# Patient Record
Sex: Female | Born: 1980 | Race: Asian | Hispanic: No | Marital: Married | Smoking: Never smoker
Health system: Southern US, Community
[De-identification: ages and names within clinical notes are randomized; demographics above are authoritative.]

## PROBLEM LIST (undated history)

## (undated) DIAGNOSIS — E119 Type 2 diabetes mellitus without complications: Secondary | ICD-10-CM

## (undated) DIAGNOSIS — E785 Hyperlipidemia, unspecified: Secondary | ICD-10-CM

## (undated) HISTORY — DX: Type 2 diabetes mellitus without complications: E11.9

## (undated) HISTORY — PX: BREAST EXCISIONAL BIOPSY: SUR124

## (undated) HISTORY — DX: Hyperlipidemia, unspecified: E78.5

## (undated) HISTORY — PX: BREAST BIOPSY: SHX20

---

## 2009-05-17 HISTORY — PX: MICRODISCECTOMY LUMBAR: SUR864

## 2011-08-23 IMAGING — CR Esophagram
2 series · 2 of 2 positions shown · non-contrast
Comparison: none

Final Report

Visit reason:  Dysphagia;
TECHNIQUE: Multiple fluoroscopic spot films and overhead views were 
obtained.

[PA]
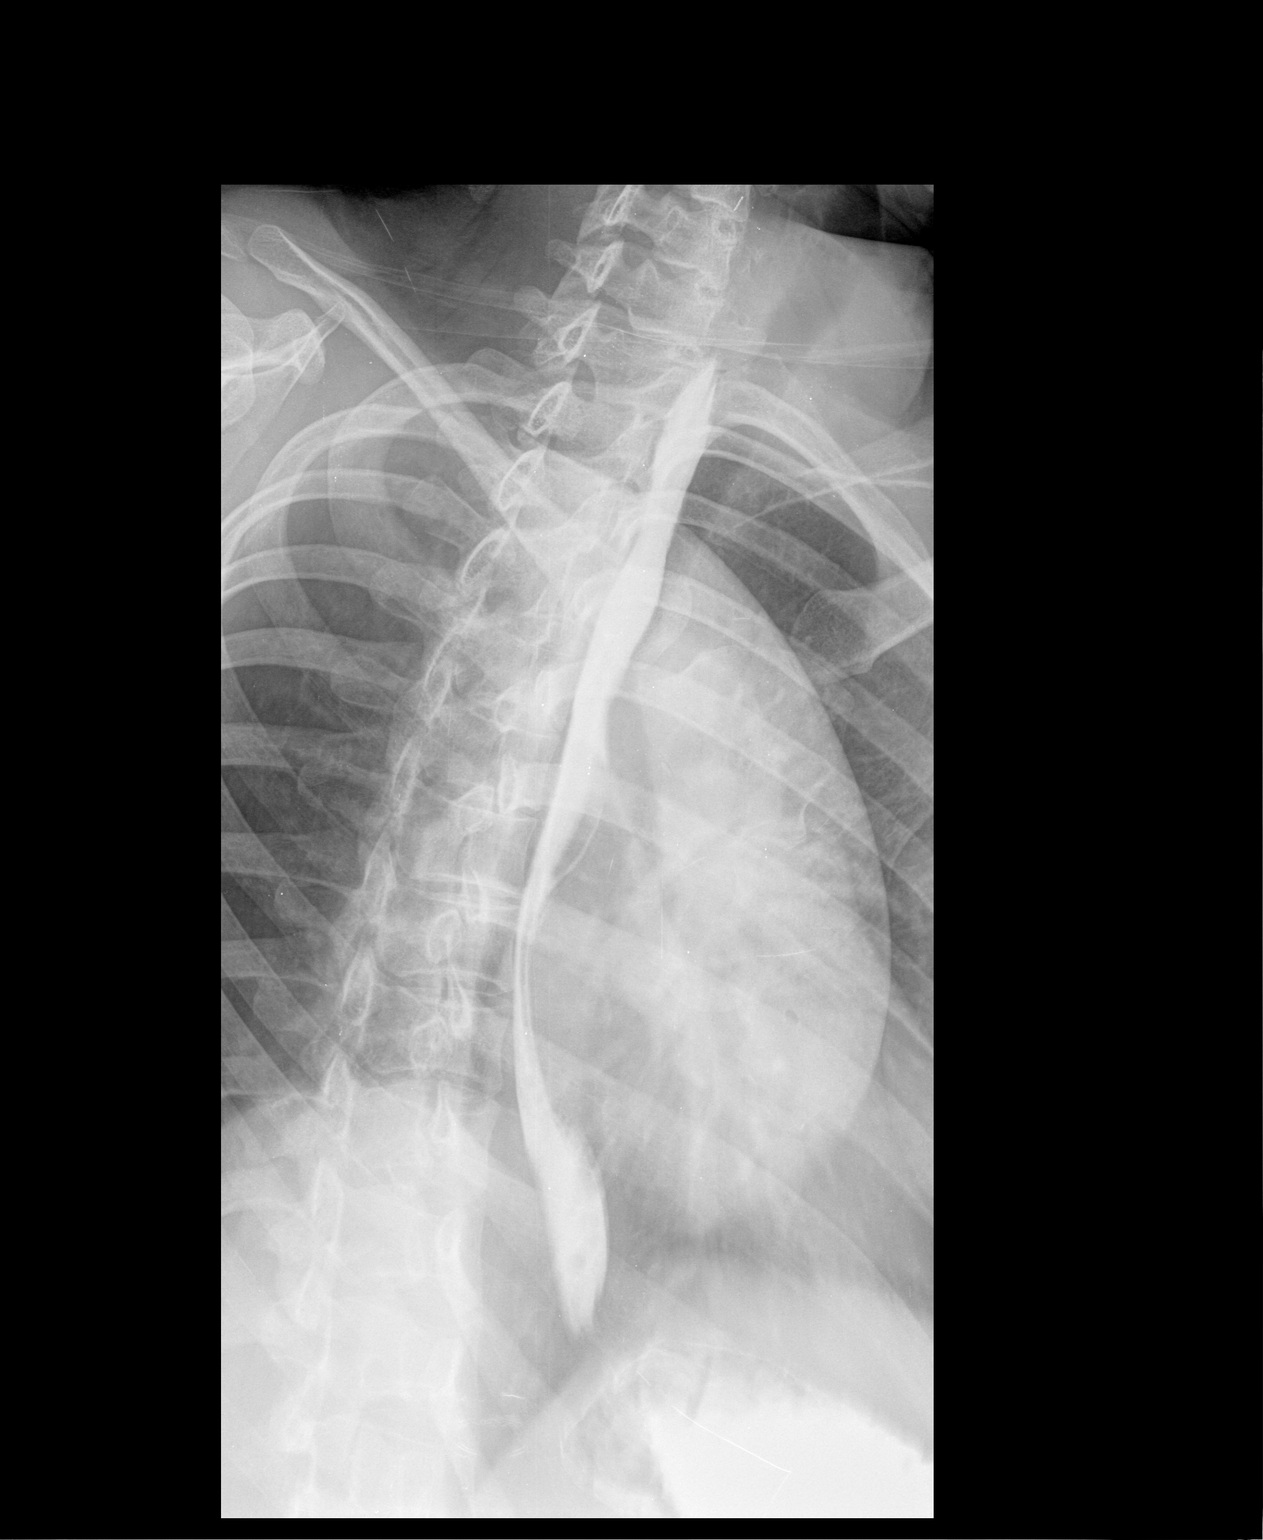

[left lateral]
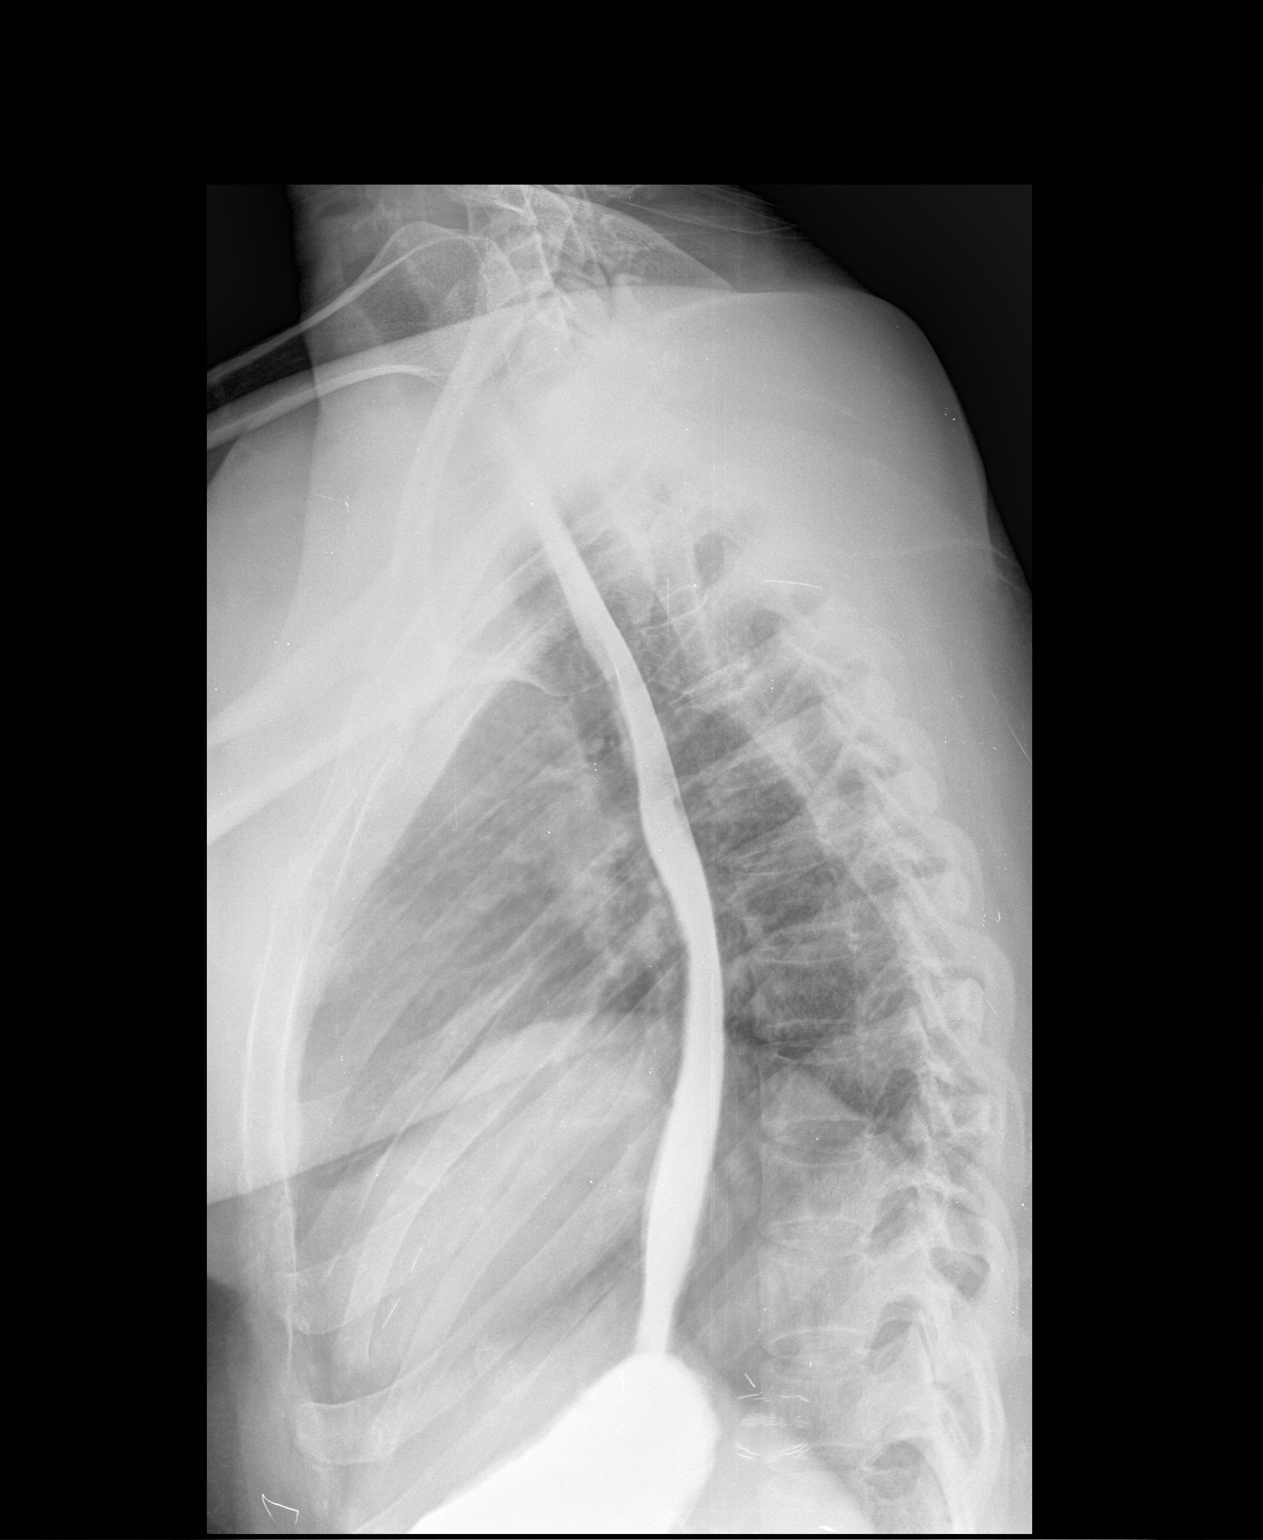

[2 of 2 positions shown; findings below may reference images not displayed]

FINDINGS: Examination of the esophagus demonstrates normal peristalsis with no 

evidence of stricture or  neoplasm.  There is no hiatus hernia or 

reflux.
IMPRESSION: Normal esophagram

## 2011-08-23 IMAGING — RF DR STANDARD
1 series · 6 of 6 positions shown · non-contrast
Comparison: none

Final Report

Visit reason:  Dysphagia;
TECHNIQUE: Multiple fluoroscopic spot films and overhead views were 
obtained.

[Series 1: single · 6 of 6 slices shown]
[im 1/6]
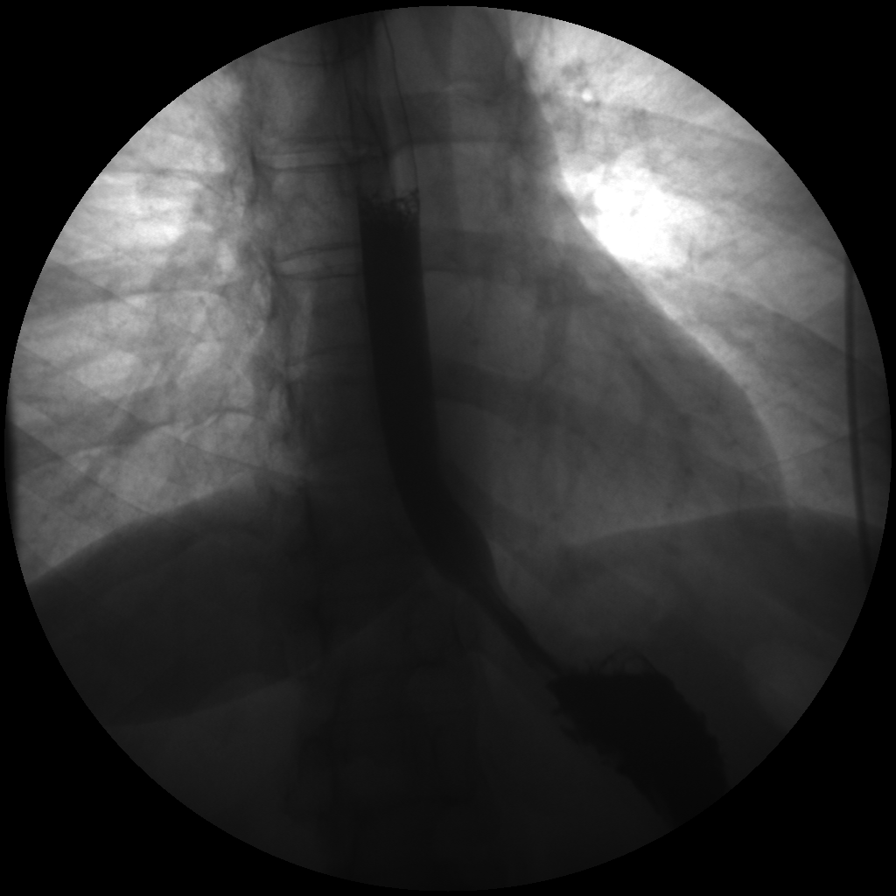
[im 2/6]
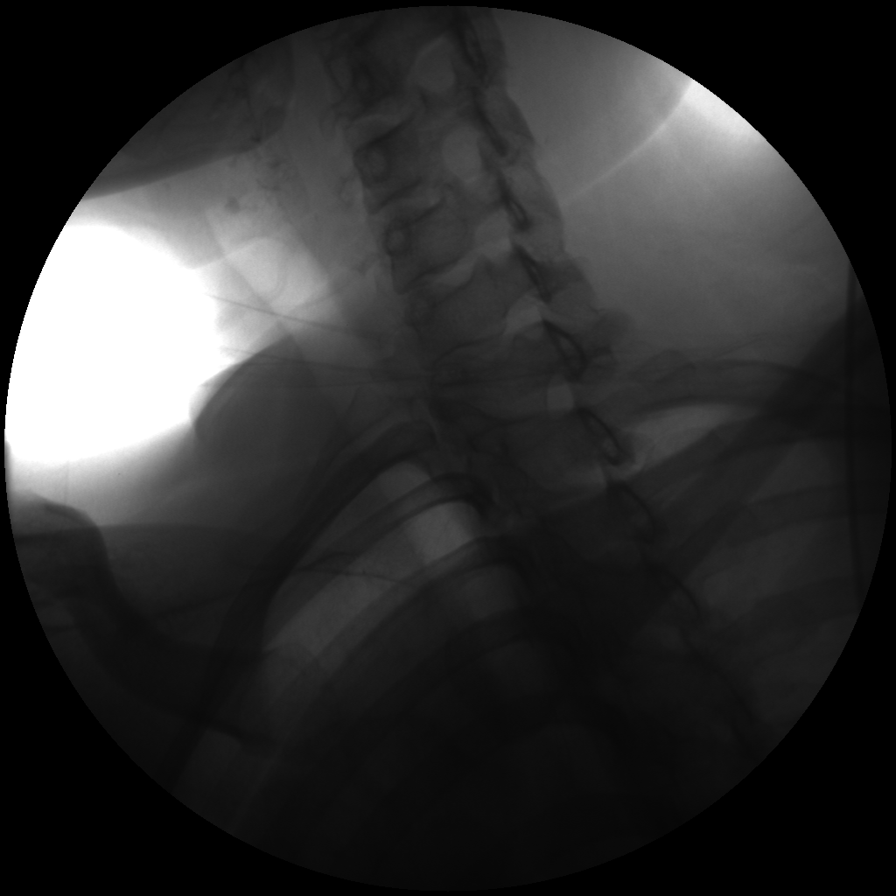
[im 3/6]
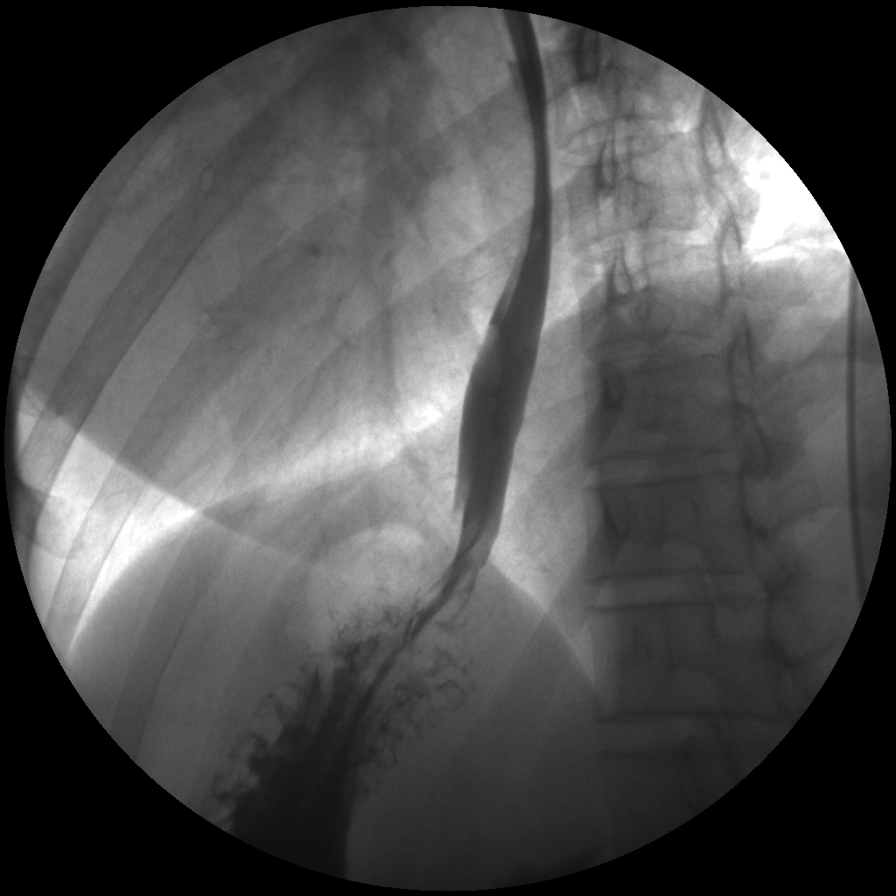
[im 4/6]
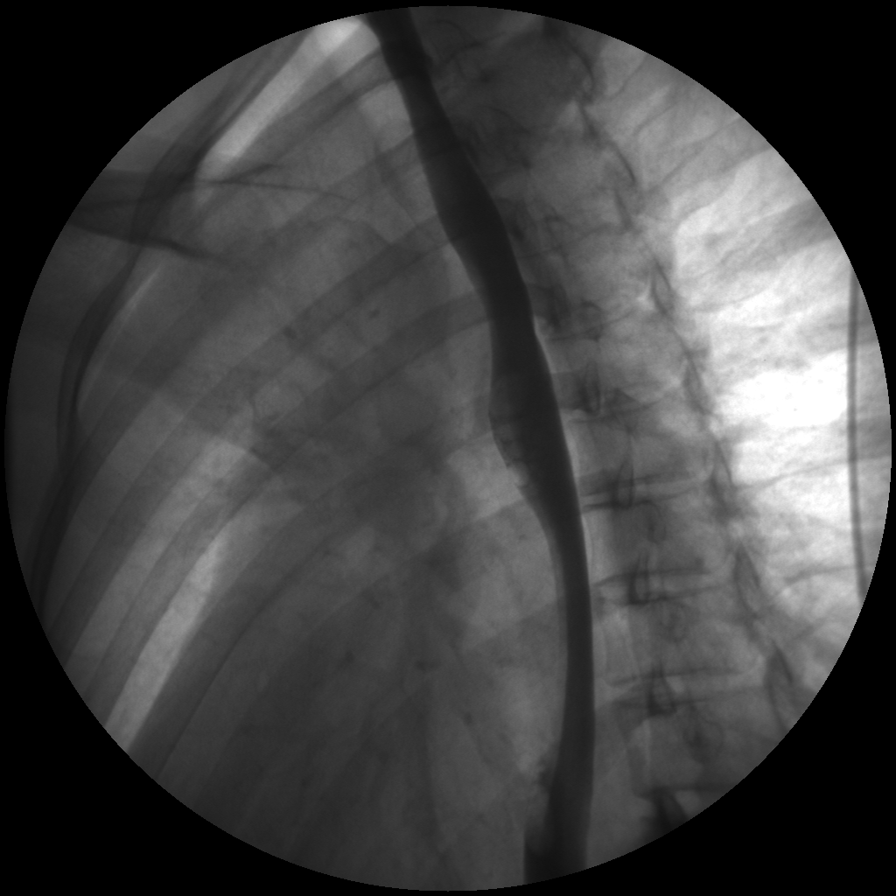
[im 5/6]
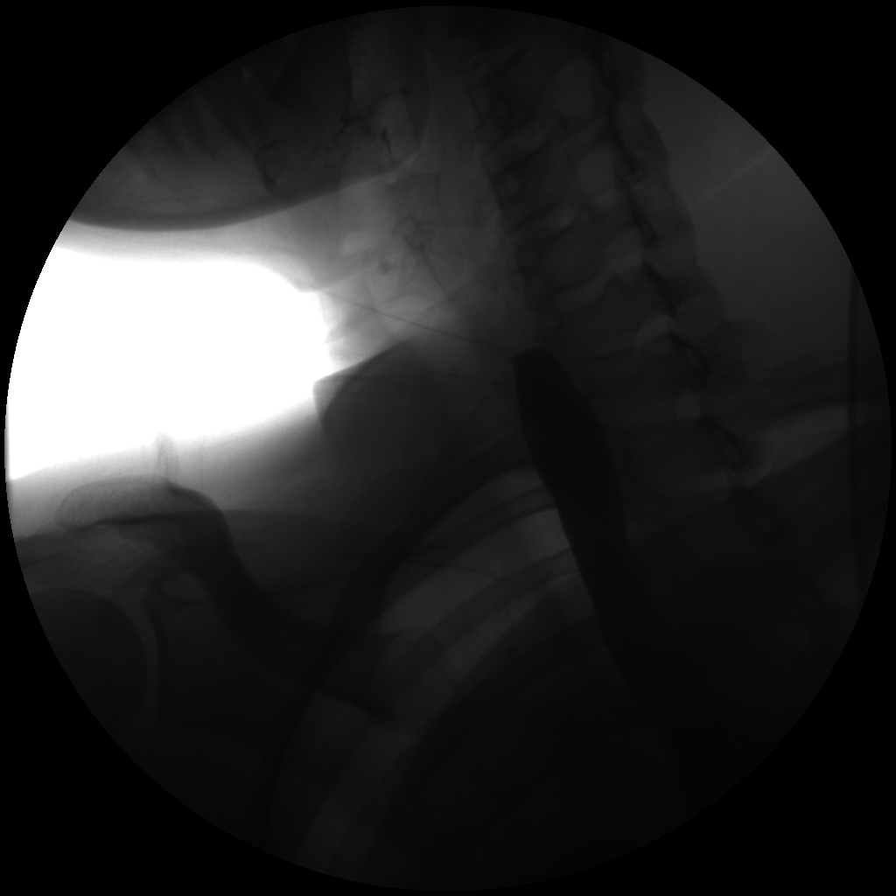
[im 6/6]
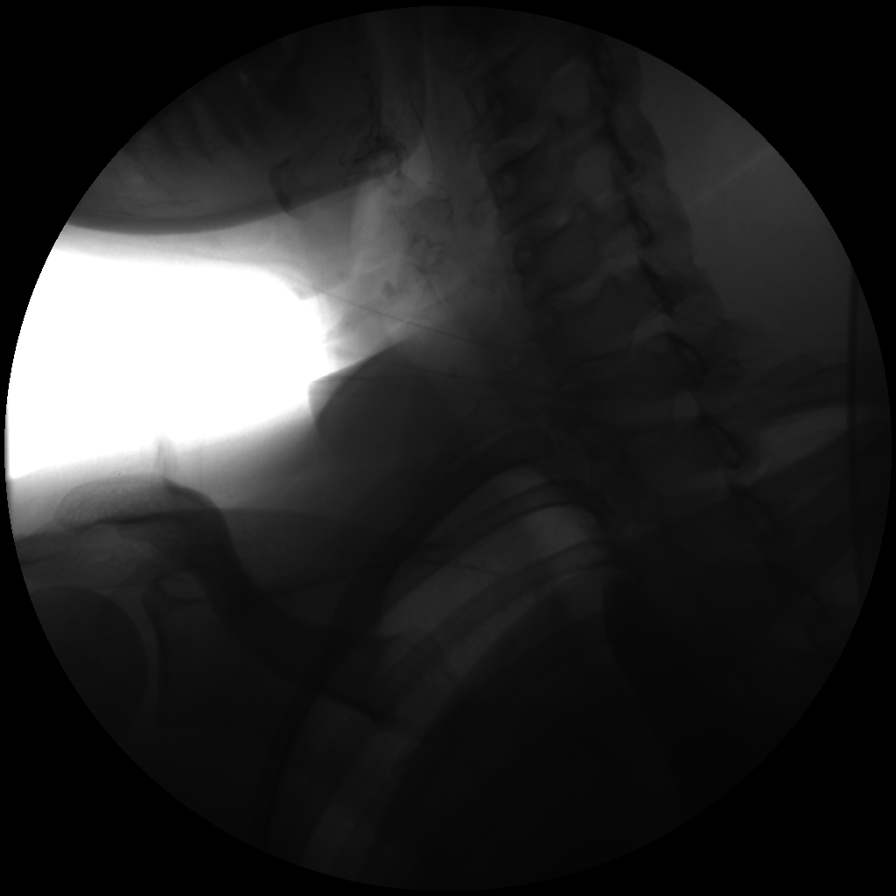

[6 of 6 positions shown; findings below may reference images not displayed]

FINDINGS: Examination of the esophagus demonstrates normal peristalsis with no 

evidence of stricture or  neoplasm.  There is no hiatus hernia or 

reflux.
IMPRESSION: Normal esophagram

## 2014-05-17 HISTORY — PX: OVARIAN CYST REMOVAL: SHX89

## 2016-04-30 ENCOUNTER — Other Ambulatory Visit: Payer: Self-pay | Admitting: Obstetrics and Gynecology

## 2016-04-30 DIAGNOSIS — Z853 Personal history of malignant neoplasm of breast: Secondary | ICD-10-CM

## 2017-01-14 ENCOUNTER — Other Ambulatory Visit: Payer: Self-pay | Admitting: Obstetrics and Gynecology

## 2017-01-14 ENCOUNTER — Ambulatory Visit
Admission: RE | Admit: 2017-01-14 | Discharge: 2017-01-14 | Disposition: A | Discharge status: Home / Self Care | Payer: BLUE CROSS/BLUE SHIELD | Source: Ambulatory Visit | Attending: Obstetrics and Gynecology | Admitting: Obstetrics and Gynecology

## 2017-01-14 DIAGNOSIS — Z853 Personal history of malignant neoplasm of breast: Secondary | ICD-10-CM

## 2017-01-14 DIAGNOSIS — Z87898 Personal history of other specified conditions: Secondary | ICD-10-CM

## 2017-01-14 IMAGING — MG 2D DIGITAL DIAGNOSTIC BILATERAL MAMMOGRAM WITH CAD AND ADJUNCT T
8 of 12 series · 8 of 28 positions shown · non-contrast
Comparison: Previous exam(s).

ADDENDUM:
This is an addendum to the recommendation section of the prior
report.

RECOMMENDATION:
As discussed with the patient on [DATE], either a screening
mammogram in [DATE] (1 year from her most recent mammogram) or
at the age of 38 would be appropriate. The patient desires to have a
screening mammogram in [DATE] and will schedule a mammogram for
that time. The recommendation for annual screening mammography was
also discussed with the patient's clinician, Dr. HMZA.
CLINICAL DATA: On 36-year-old female presenting for annual
bilateral mammogram. History of right breast excisional biopsy and
benign left breast biopsy.
EXAM:
2D DIGITAL DIAGNOSTIC BILATERAL MAMMOGRAM WITH CAD AND ADJUNCT TOMO

[L MLO]
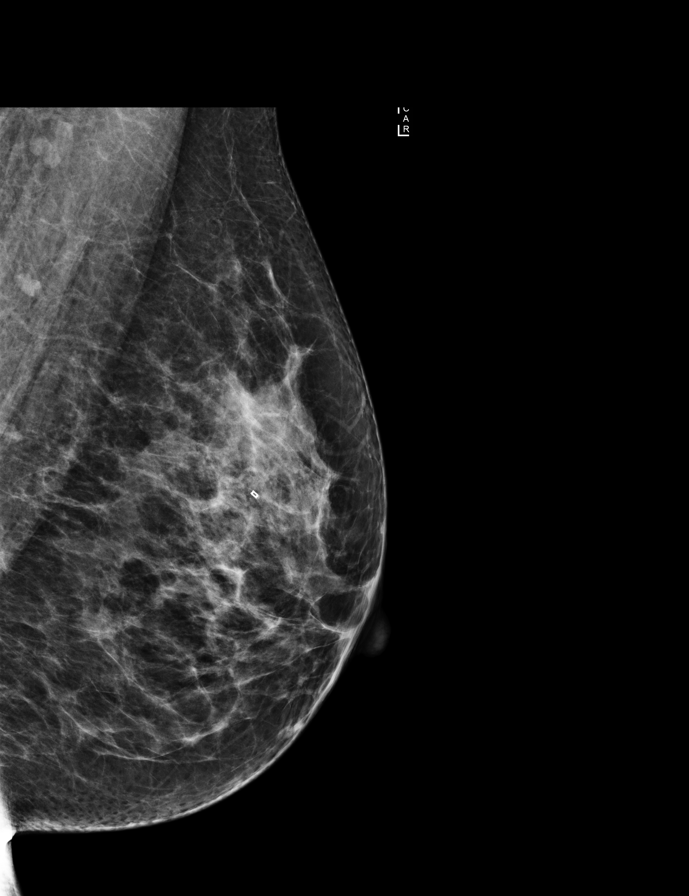

[R MLO]
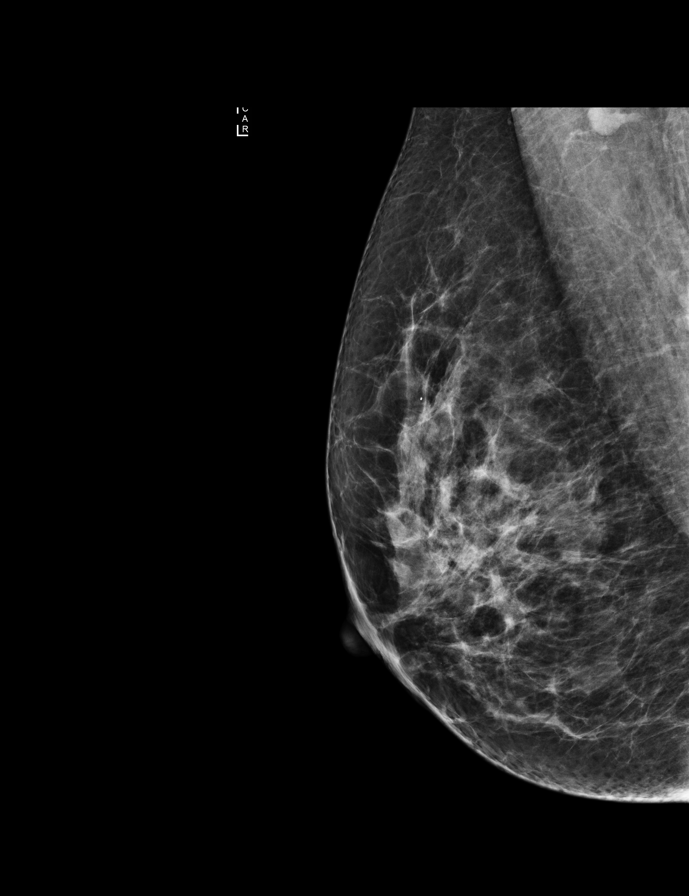

[R CC]
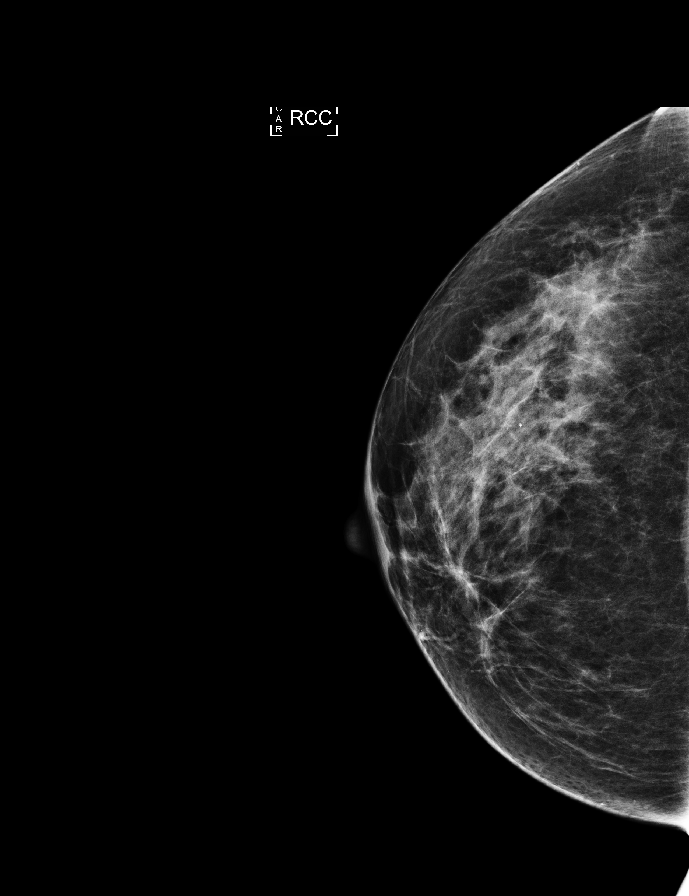

[L MLO synth-2D]
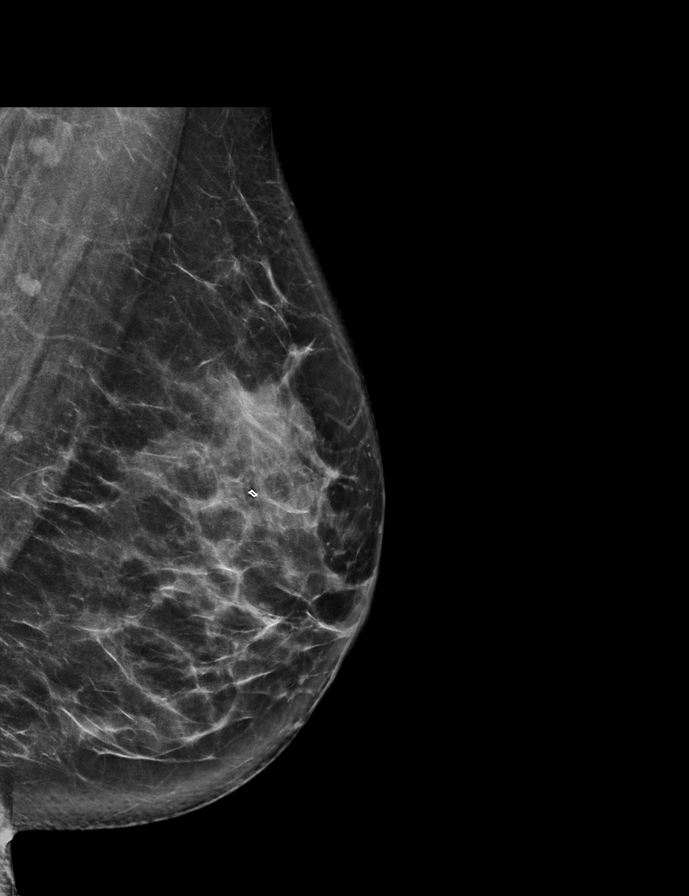

[L CC]
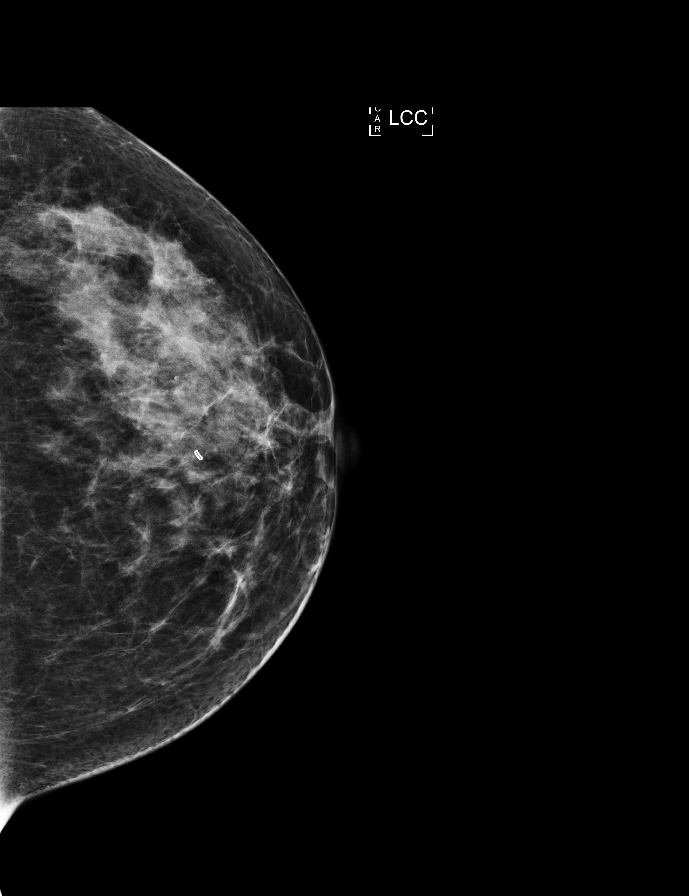

[L CC synth-2D]
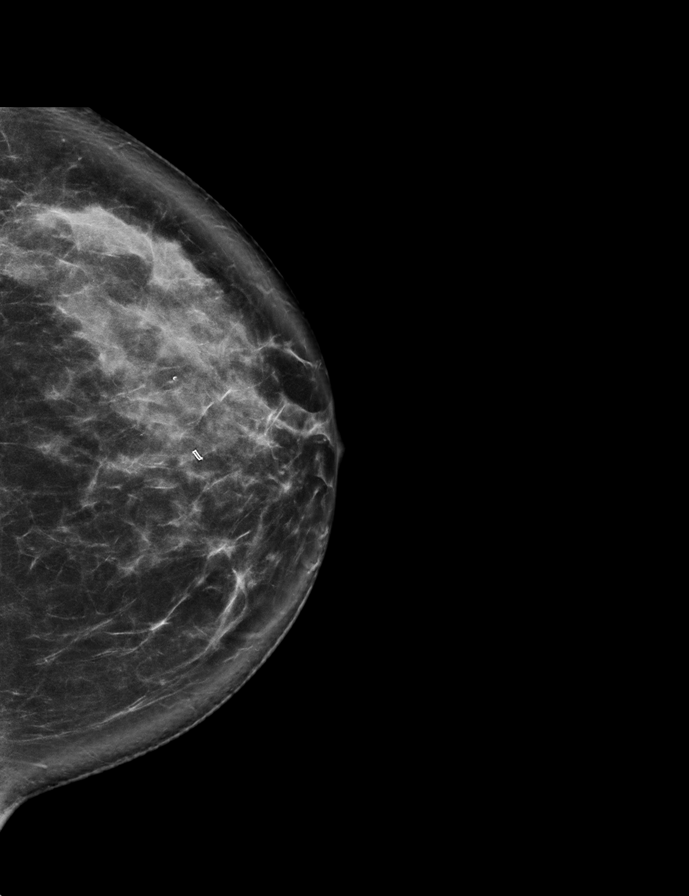

[R CC synth-2D]
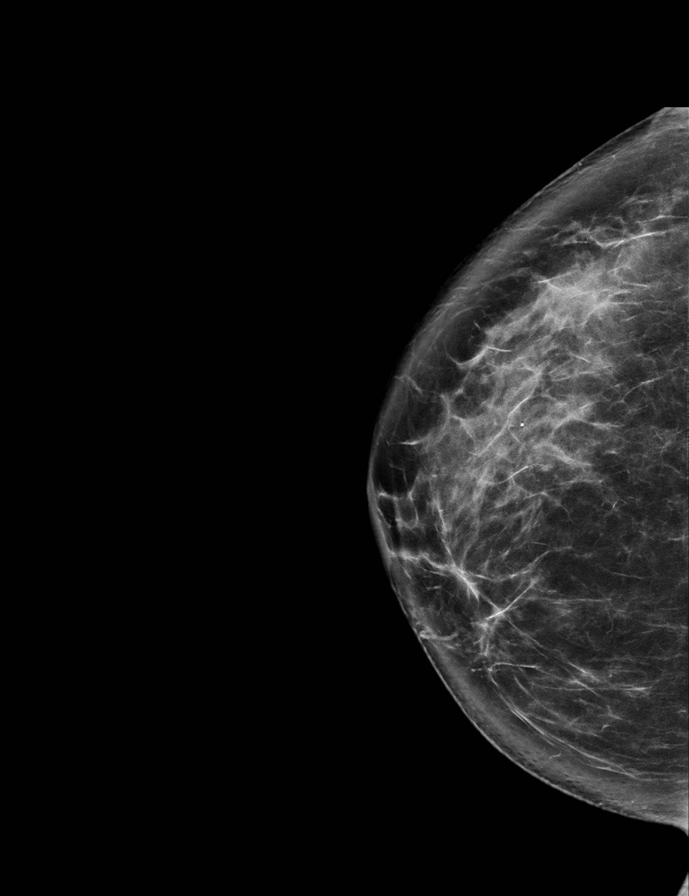

[R MLO synth-2D]
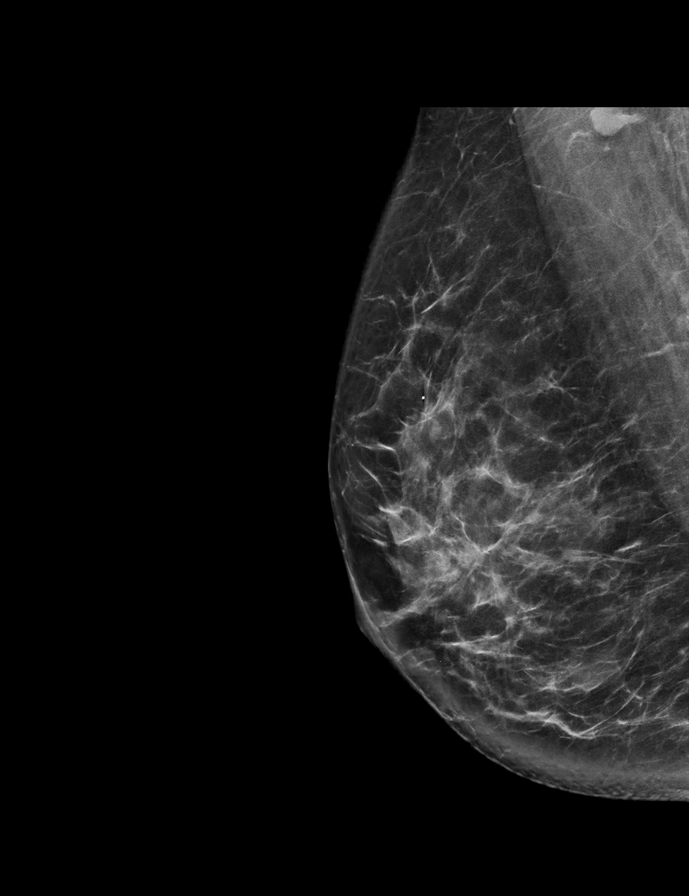

[8 of 28 positions shown; findings below may reference images not displayed]

ACR Breast Density Category c: The breast tissue is heterogeneously
dense, which may obscure small masses.
FINDINGS: No suspicious masses or calcifications are identified in either
breast. Postsurgical scarring is noted in the subareolar right
breast. There is a stable post biopsy clip in the superior left
breast at middle depth.

Mammographic images were processed with CAD.
IMPRESSION: No mammographic evidence of malignancy in either breast.

RECOMMENDATION:
Screening mammogram at age 38 unless there are persistent or
intervening clinical concerns. (Code:[7Q])

I have discussed the findings and recommendations with the patient.
Results were also provided in writing at the conclusion of the
visit. If applicable, a reminder letter will be sent to the patient
regarding the next appointment.

BI-RADS CATEGORY  2: Benign.

## 2017-01-21 DIAGNOSIS — D369 Benign neoplasm, unspecified site: Secondary | ICD-10-CM | POA: Diagnosis not present

## 2017-02-02 ENCOUNTER — Other Ambulatory Visit: Payer: Self-pay | Admitting: Surgery

## 2017-02-02 DIAGNOSIS — D369 Benign neoplasm, unspecified site: Secondary | ICD-10-CM

## 2017-02-07 ENCOUNTER — Ambulatory Visit
Admission: RE | Admit: 2017-02-07 | Discharge: 2017-02-07 | Disposition: A | Discharge status: Home / Self Care | Payer: BLUE CROSS/BLUE SHIELD | Source: Ambulatory Visit | Attending: Surgery | Admitting: Surgery

## 2017-02-07 DIAGNOSIS — D369 Benign neoplasm, unspecified site: Secondary | ICD-10-CM

## 2017-05-02 DIAGNOSIS — Z6826 Body mass index (BMI) 26.0-26.9, adult: Secondary | ICD-10-CM | POA: Diagnosis not present

## 2017-05-02 DIAGNOSIS — Z01419 Encounter for gynecological examination (general) (routine) without abnormal findings: Secondary | ICD-10-CM | POA: Diagnosis not present

## 2017-05-02 DIAGNOSIS — N760 Acute vaginitis: Secondary | ICD-10-CM | POA: Diagnosis not present

## 2017-05-05 DIAGNOSIS — M6281 Muscle weakness (generalized): Secondary | ICD-10-CM | POA: Diagnosis not present

## 2017-05-05 DIAGNOSIS — M545 Low back pain: Secondary | ICD-10-CM | POA: Diagnosis not present

## 2017-05-06 DIAGNOSIS — M545 Low back pain: Secondary | ICD-10-CM | POA: Diagnosis not present

## 2017-05-06 DIAGNOSIS — M6281 Muscle weakness (generalized): Secondary | ICD-10-CM | POA: Diagnosis not present

## 2017-05-13 DIAGNOSIS — M545 Low back pain: Secondary | ICD-10-CM | POA: Diagnosis not present

## 2017-05-13 DIAGNOSIS — M6281 Muscle weakness (generalized): Secondary | ICD-10-CM | POA: Diagnosis not present

## 2017-06-07 DIAGNOSIS — M545 Low back pain: Secondary | ICD-10-CM | POA: Diagnosis not present

## 2017-06-07 DIAGNOSIS — M6281 Muscle weakness (generalized): Secondary | ICD-10-CM | POA: Diagnosis not present

## 2017-06-09 DIAGNOSIS — M6281 Muscle weakness (generalized): Secondary | ICD-10-CM | POA: Diagnosis not present

## 2017-06-09 DIAGNOSIS — M545 Low back pain: Secondary | ICD-10-CM | POA: Diagnosis not present

## 2017-06-14 DIAGNOSIS — M6281 Muscle weakness (generalized): Secondary | ICD-10-CM | POA: Diagnosis not present

## 2017-06-14 DIAGNOSIS — M545 Low back pain: Secondary | ICD-10-CM | POA: Diagnosis not present

## 2017-06-17 DIAGNOSIS — M545 Low back pain: Secondary | ICD-10-CM | POA: Diagnosis not present

## 2017-06-17 DIAGNOSIS — M6281 Muscle weakness (generalized): Secondary | ICD-10-CM | POA: Diagnosis not present

## 2017-06-21 DIAGNOSIS — M6281 Muscle weakness (generalized): Secondary | ICD-10-CM | POA: Diagnosis not present

## 2017-06-21 DIAGNOSIS — M545 Low back pain: Secondary | ICD-10-CM | POA: Diagnosis not present

## 2017-06-22 DIAGNOSIS — M6281 Muscle weakness (generalized): Secondary | ICD-10-CM | POA: Diagnosis not present

## 2017-06-22 DIAGNOSIS — M545 Low back pain: Secondary | ICD-10-CM | POA: Diagnosis not present

## 2017-06-28 DIAGNOSIS — M545 Low back pain: Secondary | ICD-10-CM | POA: Diagnosis not present

## 2017-06-28 DIAGNOSIS — M6281 Muscle weakness (generalized): Secondary | ICD-10-CM | POA: Diagnosis not present

## 2017-06-30 DIAGNOSIS — M6281 Muscle weakness (generalized): Secondary | ICD-10-CM | POA: Diagnosis not present

## 2017-06-30 DIAGNOSIS — M545 Low back pain: Secondary | ICD-10-CM | POA: Diagnosis not present

## 2017-07-12 DIAGNOSIS — M6281 Muscle weakness (generalized): Secondary | ICD-10-CM | POA: Diagnosis not present

## 2017-07-12 DIAGNOSIS — M545 Low back pain: Secondary | ICD-10-CM | POA: Diagnosis not present

## 2017-07-14 DIAGNOSIS — M545 Low back pain: Secondary | ICD-10-CM | POA: Diagnosis not present

## 2017-07-14 DIAGNOSIS — M6281 Muscle weakness (generalized): Secondary | ICD-10-CM | POA: Diagnosis not present

## 2017-07-19 DIAGNOSIS — M545 Low back pain: Secondary | ICD-10-CM | POA: Diagnosis not present

## 2017-07-19 DIAGNOSIS — M6281 Muscle weakness (generalized): Secondary | ICD-10-CM | POA: Diagnosis not present

## 2017-12-31 ENCOUNTER — Other Ambulatory Visit: Payer: Self-pay | Admitting: Surgery

## 2017-12-31 DIAGNOSIS — D369 Benign neoplasm, unspecified site: Secondary | ICD-10-CM

## 2018-01-19 ENCOUNTER — Ambulatory Visit: Payer: BLUE CROSS/BLUE SHIELD

## 2018-01-19 ENCOUNTER — Other Ambulatory Visit: Payer: BLUE CROSS/BLUE SHIELD

## 2018-01-19 ENCOUNTER — Ambulatory Visit
Admission: RE | Admit: 2018-01-19 | Discharge: 2018-01-19 | Disposition: A | Discharge status: Home / Self Care | Payer: BLUE CROSS/BLUE SHIELD | Source: Ambulatory Visit | Attending: Surgery | Admitting: Surgery

## 2018-01-19 DIAGNOSIS — D369 Benign neoplasm, unspecified site: Secondary | ICD-10-CM

## 2018-01-19 DIAGNOSIS — R922 Inconclusive mammogram: Secondary | ICD-10-CM | POA: Diagnosis not present

## 2018-01-19 IMAGING — MG DIGITAL DIAGNOSTIC BILATERAL MAMMOGRAM WITH TOMO AND CAD
8 series · 9 of 24 positions shown · non-contrast
Comparison: Previous exam(s).

CLINICAL DATA: 37-year-old female with history of prior benign
right breast excision [XR] for intraductal papilloma. Prior benign
left breast biopsy [XR].

EXAM:
DIGITAL DIAGNOSTIC BILATERAL MAMMOGRAM WITH CAD AND TOMO

[R CC synth-2D]
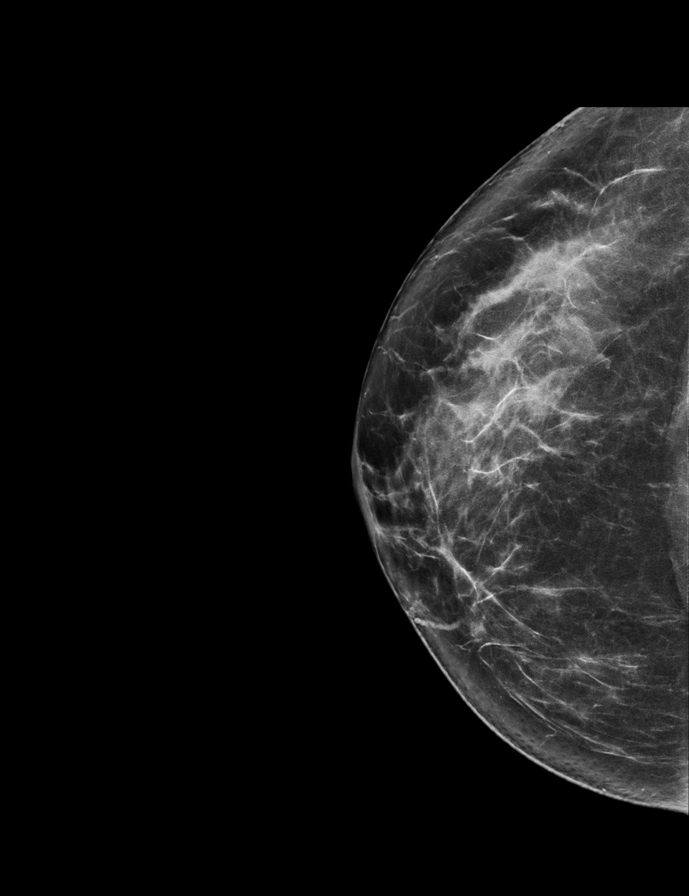

[L CC synth-2D]
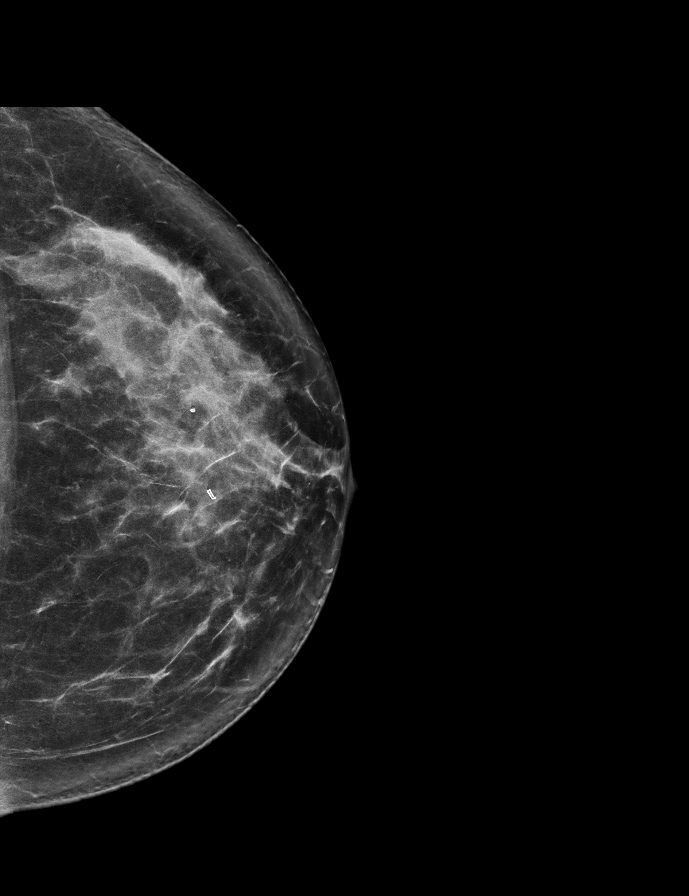

[R MLO synth-2D]
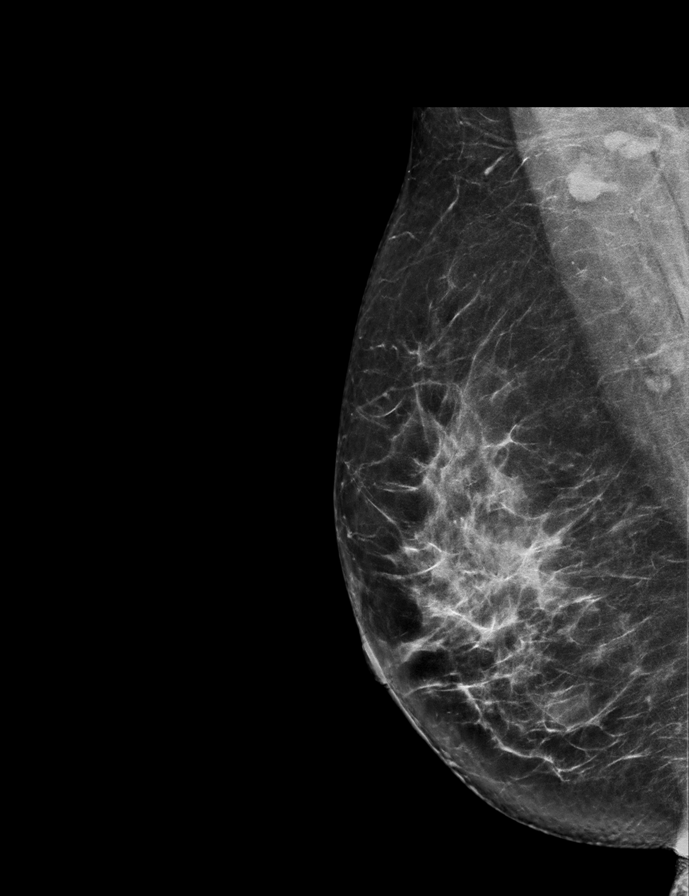

[L MLO synth-2D]
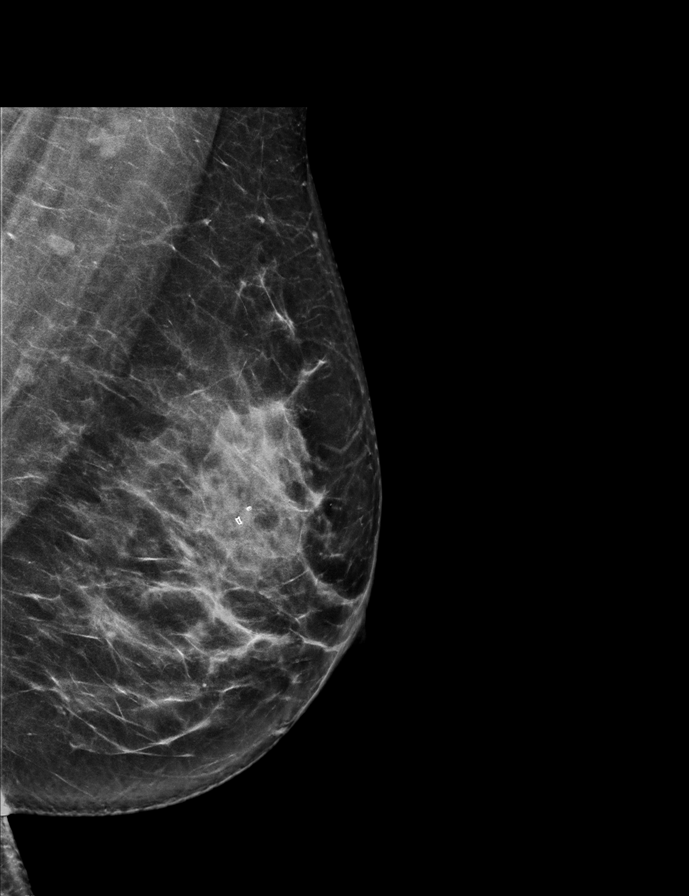

[L MLO tomo · 2 of 76 frames shown]
[frame 25/76]
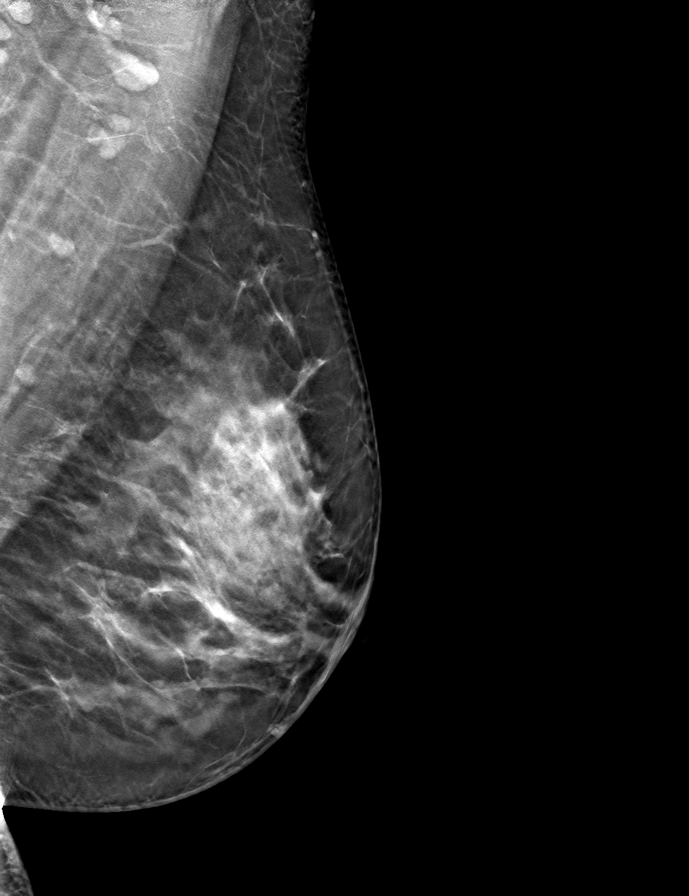
[frame 39/76]
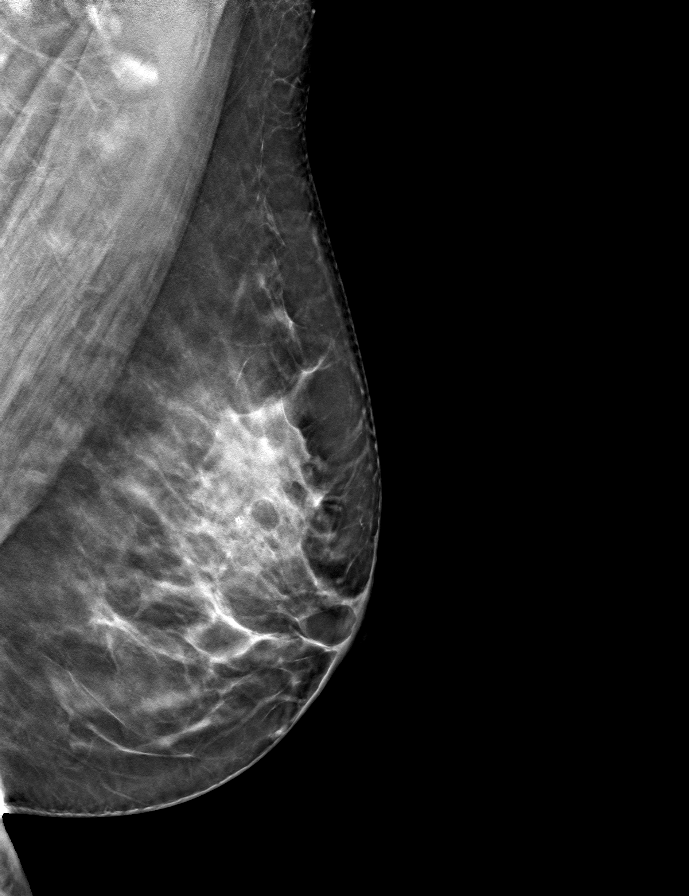

[R CC tomo · tomo slice 37/73.0]
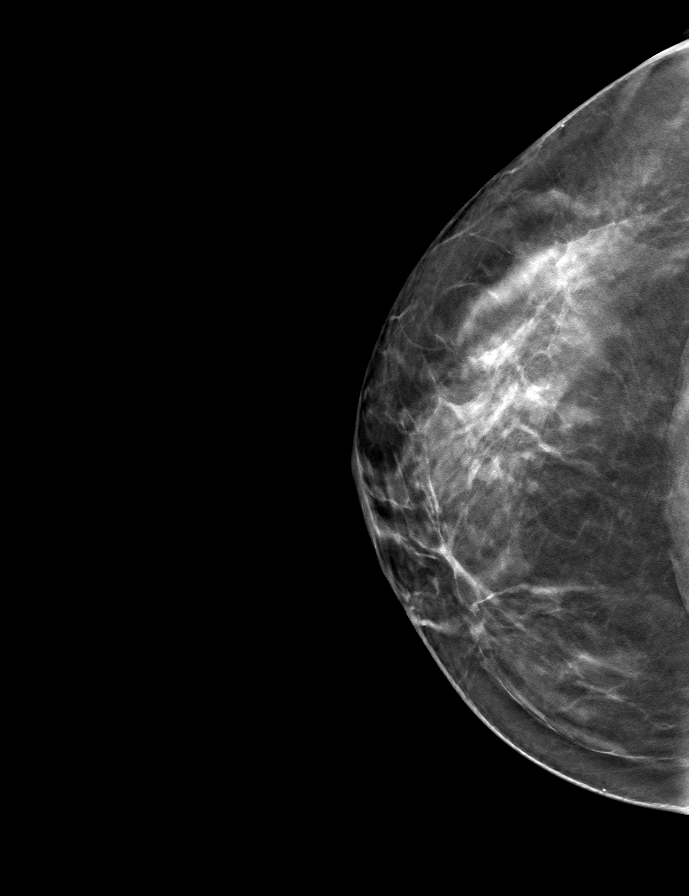

[R MLO tomo · tomo slice 40/79.0]
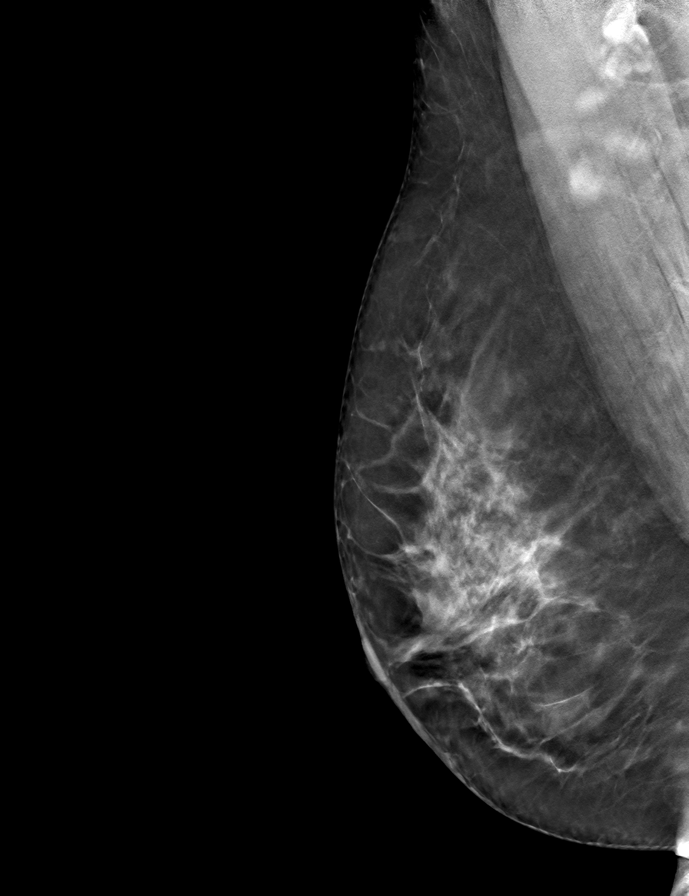

[L CC tomo · tomo slice 39/77.0]
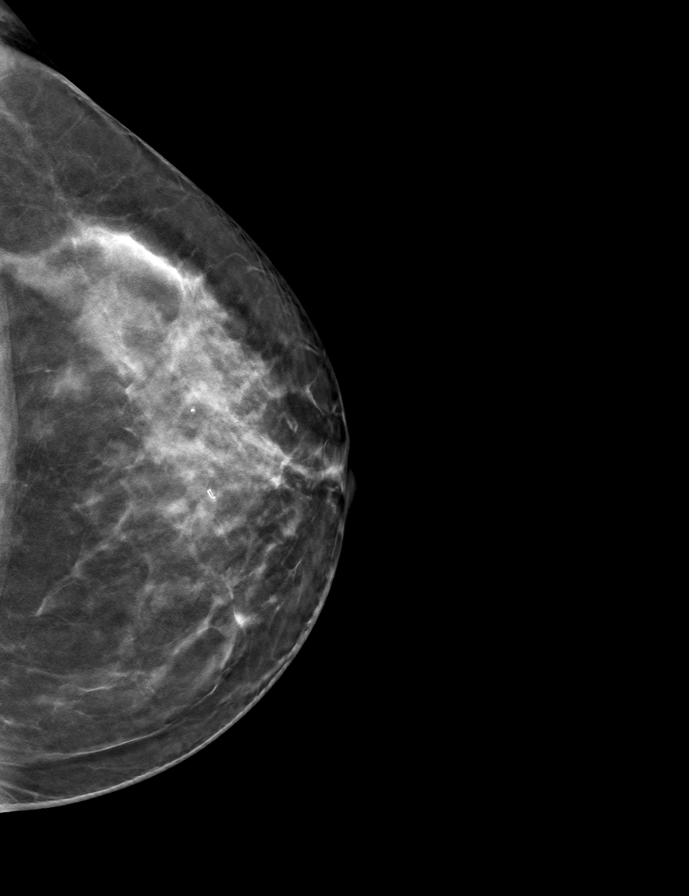

[9 of 24 positions shown; findings below may reference images not displayed]

ACR Breast Density Category c: The breast tissue is heterogeneously
dense, which may obscure small masses.
FINDINGS: No suspicious masses or calcifications are seen in either breast.
Postsurgical scarring is again identified in the subareolar right
breast related to prior benign excision of papilloma. Biopsy marking
clip in the left breast is again identified at site of prior benign
biopsy. There is no mammographic evidence of malignancy in either
breast.

Mammographic images were processed with CAD.
IMPRESSION: No mammographic evidence of malignancy in either breast.

RECOMMENDATION:
Screening mammogram in one year.(Code:[XR])

I have discussed the findings and recommendations with the patient.
Results were also provided in writing at the conclusion of the
visit. If applicable, a reminder letter will be sent to the patient
regarding the next appointment.

BI-RADS CATEGORY  1: Negative.

## 2018-01-31 ENCOUNTER — Other Ambulatory Visit: Payer: Self-pay | Admitting: Surgery

## 2018-01-31 DIAGNOSIS — D369 Benign neoplasm, unspecified site: Secondary | ICD-10-CM | POA: Diagnosis not present

## 2018-05-01 DIAGNOSIS — M5442 Lumbago with sciatica, left side: Secondary | ICD-10-CM | POA: Diagnosis not present

## 2018-05-01 DIAGNOSIS — M545 Low back pain: Secondary | ICD-10-CM | POA: Diagnosis not present

## 2018-05-22 DIAGNOSIS — M545 Low back pain: Secondary | ICD-10-CM | POA: Diagnosis not present

## 2018-05-22 DIAGNOSIS — M6281 Muscle weakness (generalized): Secondary | ICD-10-CM | POA: Diagnosis not present

## 2018-05-23 DIAGNOSIS — M545 Low back pain: Secondary | ICD-10-CM | POA: Diagnosis not present

## 2018-05-23 DIAGNOSIS — M6281 Muscle weakness (generalized): Secondary | ICD-10-CM | POA: Diagnosis not present

## 2018-05-26 DIAGNOSIS — M545 Low back pain: Secondary | ICD-10-CM | POA: Diagnosis not present

## 2018-05-26 DIAGNOSIS — M6281 Muscle weakness (generalized): Secondary | ICD-10-CM | POA: Diagnosis not present

## 2018-05-31 DIAGNOSIS — M6281 Muscle weakness (generalized): Secondary | ICD-10-CM | POA: Diagnosis not present

## 2018-06-02 DIAGNOSIS — M6281 Muscle weakness (generalized): Secondary | ICD-10-CM | POA: Diagnosis not present

## 2018-06-06 DIAGNOSIS — M6281 Muscle weakness (generalized): Secondary | ICD-10-CM | POA: Diagnosis not present

## 2018-06-07 DIAGNOSIS — M6281 Muscle weakness (generalized): Secondary | ICD-10-CM | POA: Diagnosis not present

## 2018-06-09 DIAGNOSIS — M6281 Muscle weakness (generalized): Secondary | ICD-10-CM | POA: Diagnosis not present

## 2018-06-13 DIAGNOSIS — M6281 Muscle weakness (generalized): Secondary | ICD-10-CM | POA: Diagnosis not present

## 2018-06-14 DIAGNOSIS — M6281 Muscle weakness (generalized): Secondary | ICD-10-CM | POA: Diagnosis not present

## 2018-06-16 DIAGNOSIS — M6281 Muscle weakness (generalized): Secondary | ICD-10-CM | POA: Diagnosis not present

## 2018-06-21 DIAGNOSIS — M6281 Muscle weakness (generalized): Secondary | ICD-10-CM | POA: Diagnosis not present

## 2018-06-22 DIAGNOSIS — Z01419 Encounter for gynecological examination (general) (routine) without abnormal findings: Secondary | ICD-10-CM | POA: Diagnosis not present

## 2018-06-22 DIAGNOSIS — Z6826 Body mass index (BMI) 26.0-26.9, adult: Secondary | ICD-10-CM | POA: Diagnosis not present

## 2018-06-23 DIAGNOSIS — M6281 Muscle weakness (generalized): Secondary | ICD-10-CM | POA: Diagnosis not present

## 2018-06-27 DIAGNOSIS — M545 Low back pain: Secondary | ICD-10-CM | POA: Diagnosis not present

## 2018-06-27 DIAGNOSIS — M6281 Muscle weakness (generalized): Secondary | ICD-10-CM | POA: Diagnosis not present

## 2018-06-28 DIAGNOSIS — M6281 Muscle weakness (generalized): Secondary | ICD-10-CM | POA: Diagnosis not present

## 2018-07-11 DIAGNOSIS — R635 Abnormal weight gain: Secondary | ICD-10-CM | POA: Diagnosis not present

## 2018-07-11 DIAGNOSIS — R7301 Impaired fasting glucose: Secondary | ICD-10-CM | POA: Diagnosis not present

## 2018-07-11 DIAGNOSIS — E7800 Pure hypercholesterolemia, unspecified: Secondary | ICD-10-CM | POA: Diagnosis not present

## 2018-10-10 DIAGNOSIS — E559 Vitamin D deficiency, unspecified: Secondary | ICD-10-CM | POA: Diagnosis not present

## 2018-12-08 DIAGNOSIS — L299 Pruritus, unspecified: Secondary | ICD-10-CM | POA: Diagnosis not present

## 2018-12-08 DIAGNOSIS — R7303 Prediabetes: Secondary | ICD-10-CM | POA: Diagnosis not present

## 2018-12-08 DIAGNOSIS — N809 Endometriosis, unspecified: Secondary | ICD-10-CM | POA: Diagnosis not present

## 2018-12-08 DIAGNOSIS — R61 Generalized hyperhidrosis: Secondary | ICD-10-CM | POA: Diagnosis not present

## 2018-12-12 DIAGNOSIS — E079 Disorder of thyroid, unspecified: Secondary | ICD-10-CM | POA: Diagnosis not present

## 2018-12-12 DIAGNOSIS — R61 Generalized hyperhidrosis: Secondary | ICD-10-CM | POA: Diagnosis not present

## 2018-12-12 DIAGNOSIS — E559 Vitamin D deficiency, unspecified: Secondary | ICD-10-CM | POA: Diagnosis not present

## 2018-12-12 DIAGNOSIS — R7303 Prediabetes: Secondary | ICD-10-CM | POA: Diagnosis not present

## 2018-12-20 DIAGNOSIS — E559 Vitamin D deficiency, unspecified: Secondary | ICD-10-CM | POA: Diagnosis not present

## 2018-12-20 DIAGNOSIS — E782 Mixed hyperlipidemia: Secondary | ICD-10-CM | POA: Diagnosis not present

## 2018-12-20 DIAGNOSIS — R7303 Prediabetes: Secondary | ICD-10-CM | POA: Diagnosis not present

## 2018-12-20 DIAGNOSIS — R61 Generalized hyperhidrosis: Secondary | ICD-10-CM | POA: Diagnosis not present

## 2019-01-23 DIAGNOSIS — Z1231 Encounter for screening mammogram for malignant neoplasm of breast: Secondary | ICD-10-CM | POA: Diagnosis not present

## 2019-03-30 DIAGNOSIS — Z0000 Encounter for general adult medical examination without abnormal findings: Secondary | ICD-10-CM | POA: Diagnosis not present

## 2019-04-04 DIAGNOSIS — N390 Urinary tract infection, site not specified: Secondary | ICD-10-CM | POA: Diagnosis not present

## 2019-04-04 DIAGNOSIS — R300 Dysuria: Secondary | ICD-10-CM | POA: Diagnosis not present

## 2019-04-04 DIAGNOSIS — R739 Hyperglycemia, unspecified: Secondary | ICD-10-CM | POA: Diagnosis not present

## 2019-04-04 DIAGNOSIS — Z23 Encounter for immunization: Secondary | ICD-10-CM | POA: Diagnosis not present

## 2019-04-04 DIAGNOSIS — B373 Candidiasis of vulva and vagina: Secondary | ICD-10-CM | POA: Diagnosis not present

## 2019-04-04 DIAGNOSIS — Z6827 Body mass index (BMI) 27.0-27.9, adult: Secondary | ICD-10-CM | POA: Diagnosis not present

## 2019-04-04 DIAGNOSIS — Z0000 Encounter for general adult medical examination without abnormal findings: Secondary | ICD-10-CM | POA: Diagnosis not present

## 2019-04-20 DIAGNOSIS — E7800 Pure hypercholesterolemia, unspecified: Secondary | ICD-10-CM | POA: Diagnosis not present

## 2019-04-20 DIAGNOSIS — R7301 Impaired fasting glucose: Secondary | ICD-10-CM | POA: Diagnosis not present

## 2019-05-03 DIAGNOSIS — Z23 Encounter for immunization: Secondary | ICD-10-CM | POA: Diagnosis not present

## 2021-02-02 ENCOUNTER — Other Ambulatory Visit: Payer: Self-pay | Admitting: Obstetrics and Gynecology

## 2021-02-02 DIAGNOSIS — R928 Other abnormal and inconclusive findings on diagnostic imaging of breast: Secondary | ICD-10-CM

## 2021-02-04 ENCOUNTER — Ambulatory Visit
Admission: RE | Admit: 2021-02-04 | Discharge: 2021-02-04 | Disposition: A | Discharge status: Home / Self Care | Payer: 59 | Source: Ambulatory Visit | Attending: Obstetrics and Gynecology | Admitting: Obstetrics and Gynecology

## 2021-02-04 ENCOUNTER — Other Ambulatory Visit: Payer: Self-pay | Admitting: Obstetrics and Gynecology

## 2021-02-04 ENCOUNTER — Other Ambulatory Visit: Payer: Self-pay

## 2021-02-04 DIAGNOSIS — R928 Other abnormal and inconclusive findings on diagnostic imaging of breast: Secondary | ICD-10-CM

## 2021-02-04 IMAGING — MG DIGITAL DIAGNOSTIC UNILAT LEFT W/ CAD
3 series · 3 of 3 positions shown · non-contrast
Comparison: Previous exam(s).

CLINICAL DATA: Patient was recalled from screening mammogram for
left breast calcifications.

EXAM:
DIGITAL DIAGNOSTIC UNILATERAL LEFT MAMMOGRAM WITH CAD
TECHNIQUE: Left digital diagnostic mammography was performed. Mammographic
images were processed with CAD.

[L ML (1 of 2)]
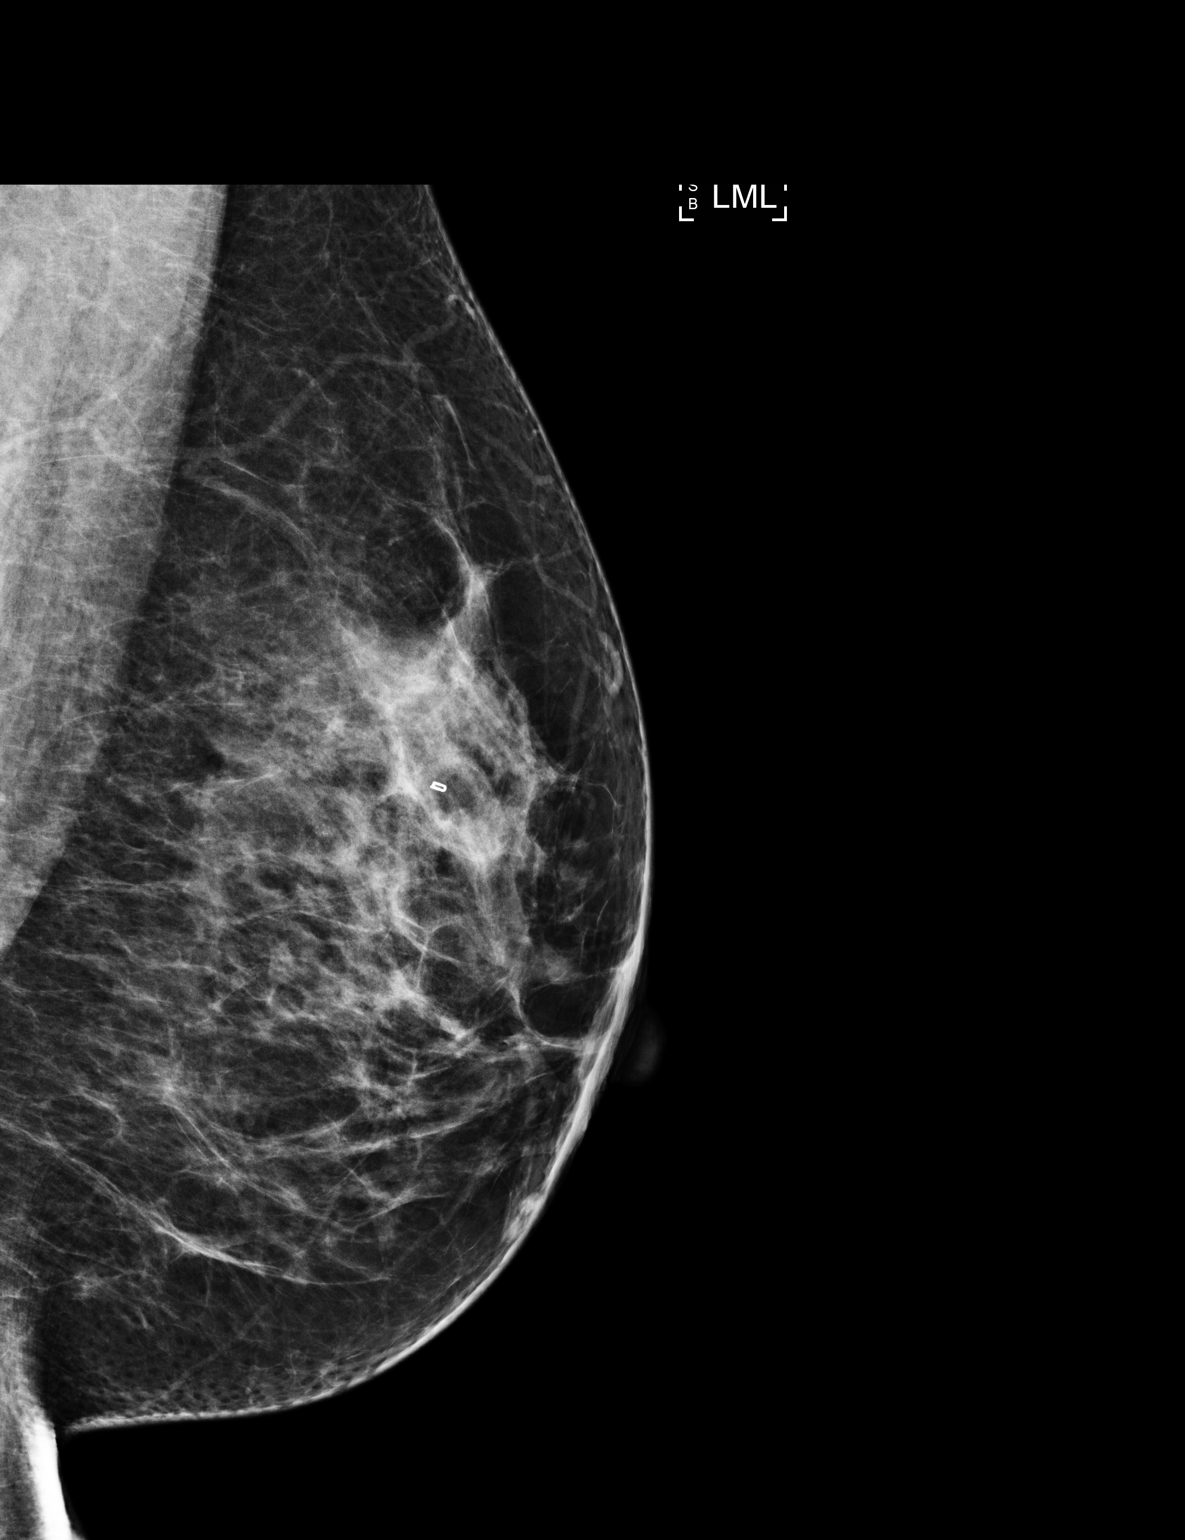

[L ML (2 of 2)]
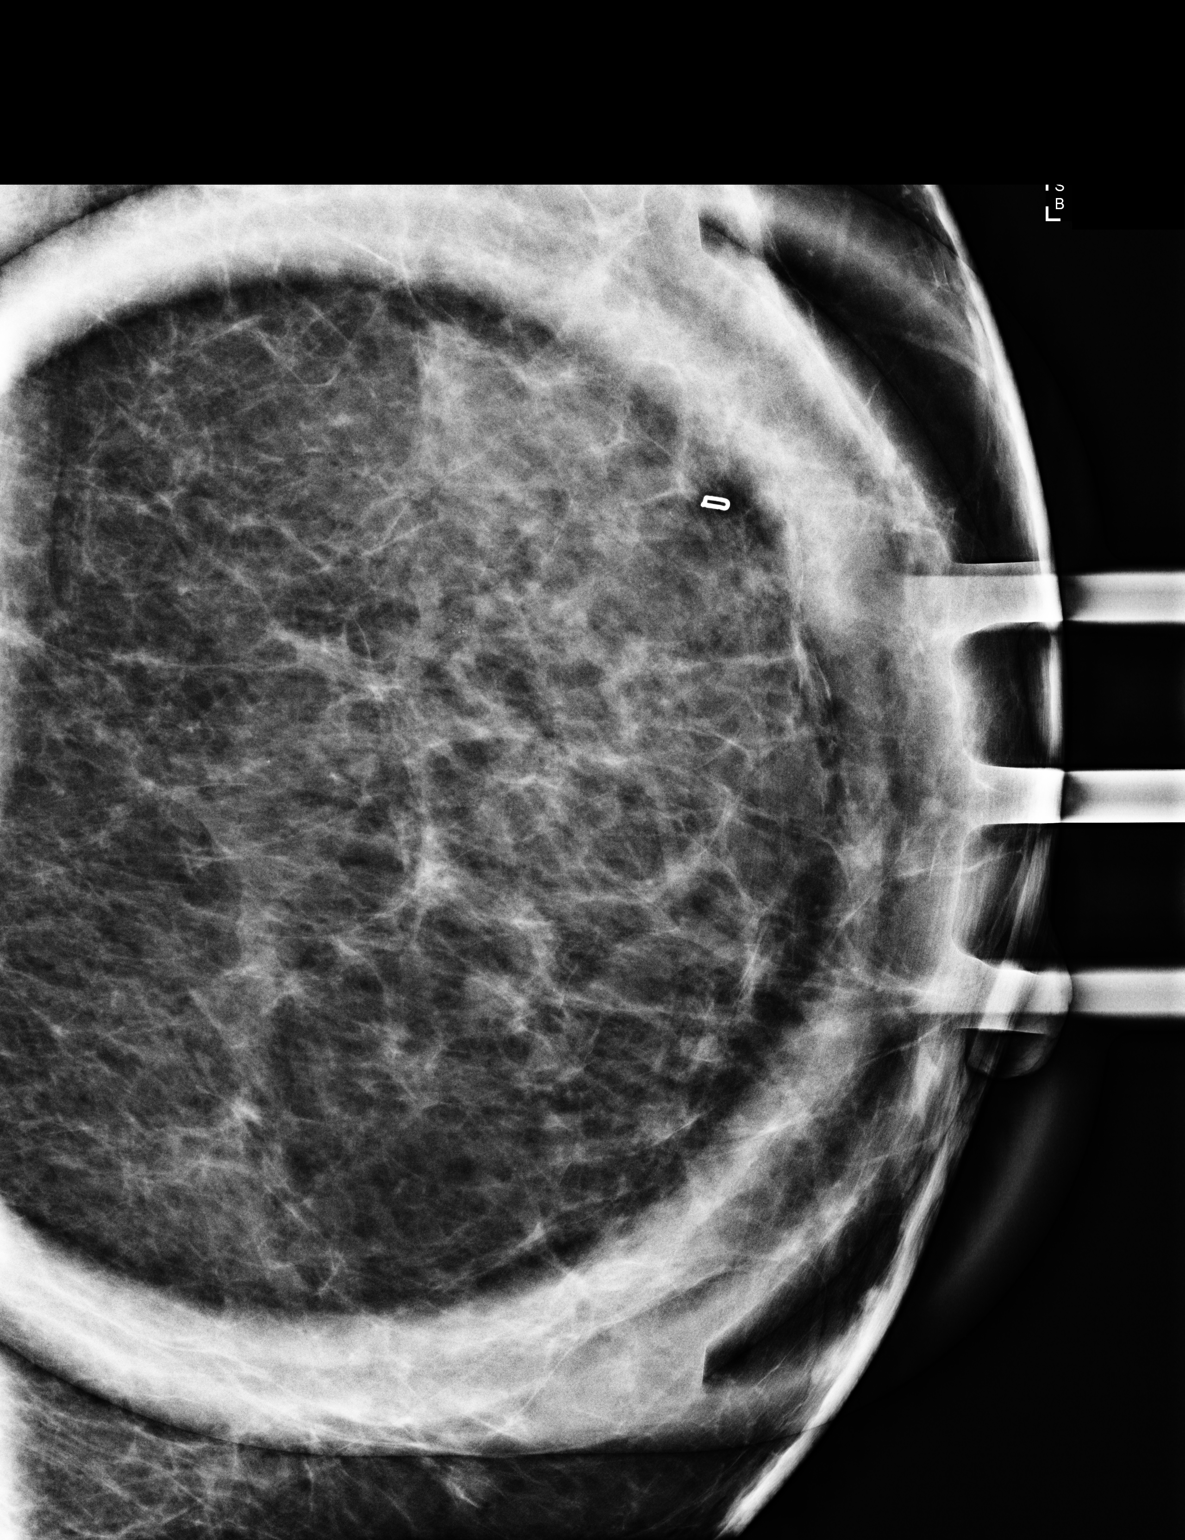

[L CC]
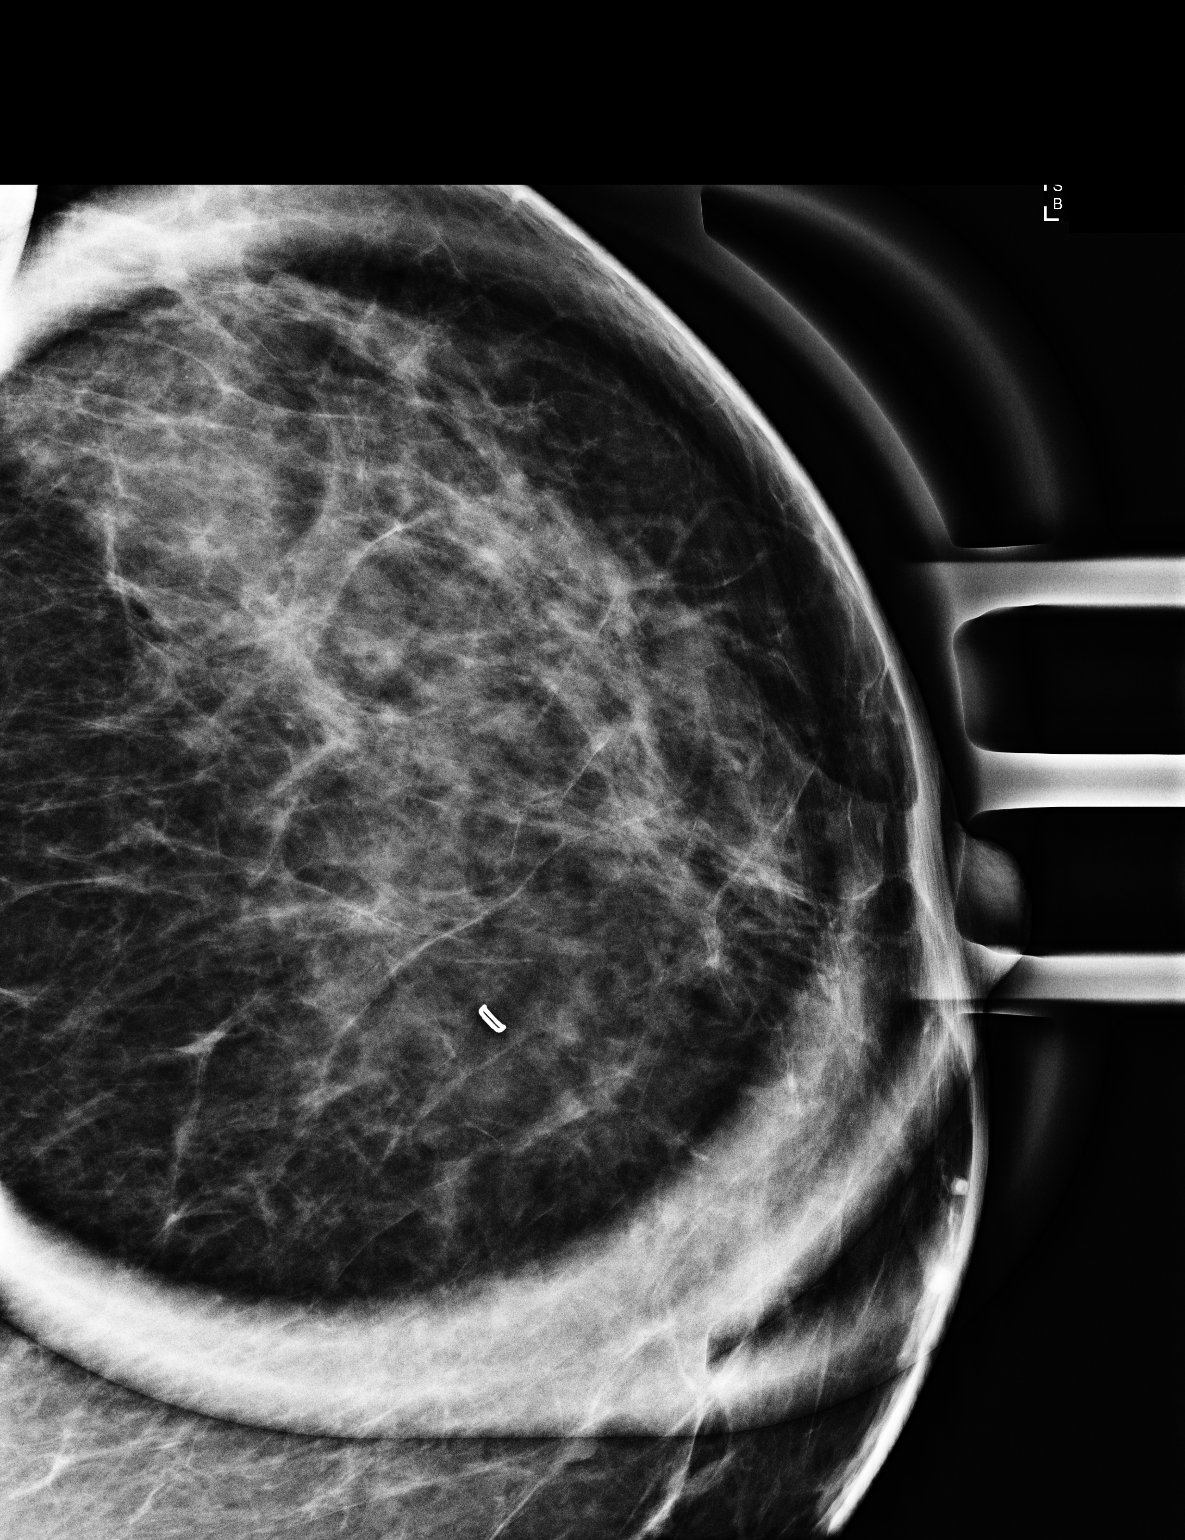

[3 of 3 positions shown; findings below may reference images not displayed]

ACR Breast Density Category c: The breast tissue is heterogeneously
dense, which may obscure small masses.
FINDINGS: Additional imaging of the left breast was performed. There are
grouped punctate calcifications spanning an area of 5 mm in the
upper-outer quadrant of the left breast. There is no associated
mass.
IMPRESSION: Indeterminate punctate calcifications in the upper-outer quadrant of
the left breast.

RECOMMENDATION:
Stereotactic biopsy of the calcifications in the upper-outer
quadrant of the left breast is recommended.

I have discussed the findings and recommendations with the patient.
If applicable, a reminder letter will be sent to the patient
regarding the next appointment.

BI-RADS CATEGORY  4: Suspicious.

## 2021-02-09 ENCOUNTER — Other Ambulatory Visit: Payer: Self-pay

## 2021-02-09 ENCOUNTER — Ambulatory Visit
Admission: RE | Admit: 2021-02-09 | Discharge: 2021-02-09 | Disposition: A | Discharge status: Home / Self Care | Payer: 59 | Source: Ambulatory Visit | Attending: Obstetrics and Gynecology | Admitting: Obstetrics and Gynecology

## 2021-02-09 DIAGNOSIS — R928 Other abnormal and inconclusive findings on diagnostic imaging of breast: Secondary | ICD-10-CM

## 2021-02-09 IMAGING — MG MM BREAST BX W LOC DEV 1ST LESION IMAGE BX SPEC STEREO GUIDE*L*
8 of 13 series · 8 of 21 positions shown · non-contrast
Comparison: Previous exams.
COMPARISON: Previous exams.

Addendum:
CLINICAL DATA: 40-year-old female with indeterminate left breast
calcifications.

EXAM:
LEFT BREAST STEREOTACTIC CORE NEEDLE BIOPSY

[L (1 of 8)]
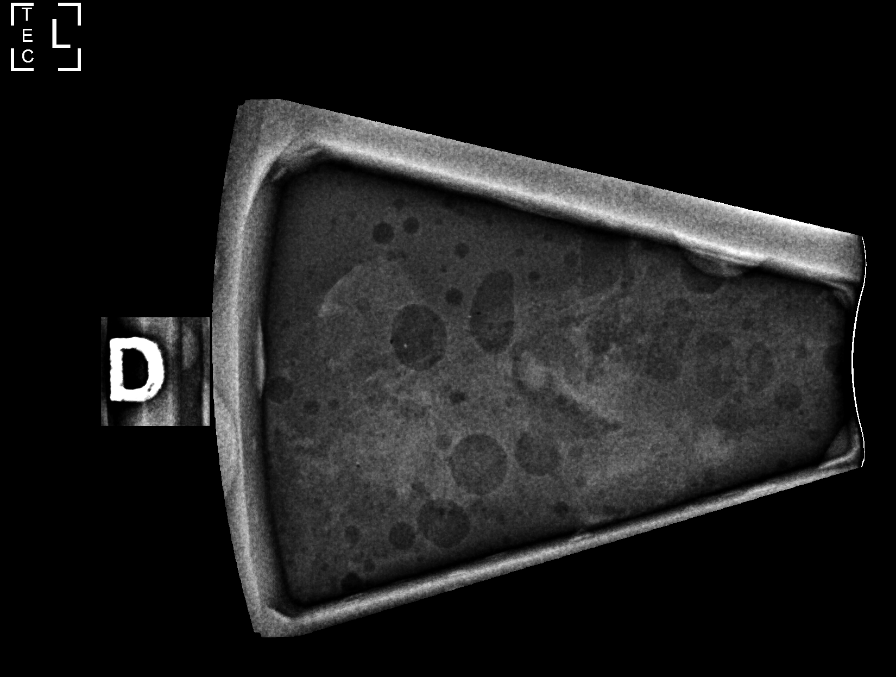

[L (2 of 8)]
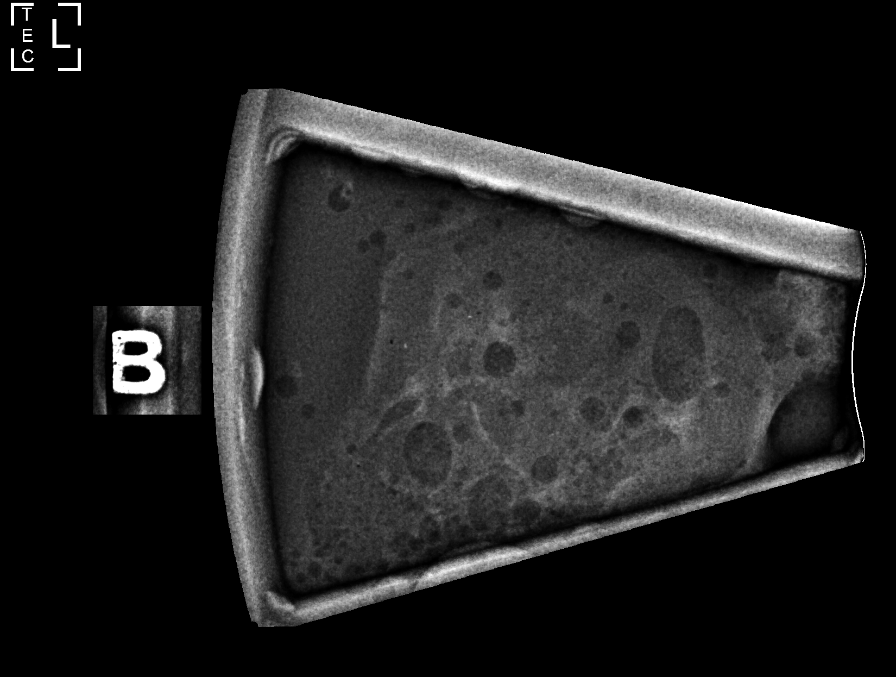

[L (3 of 8)]
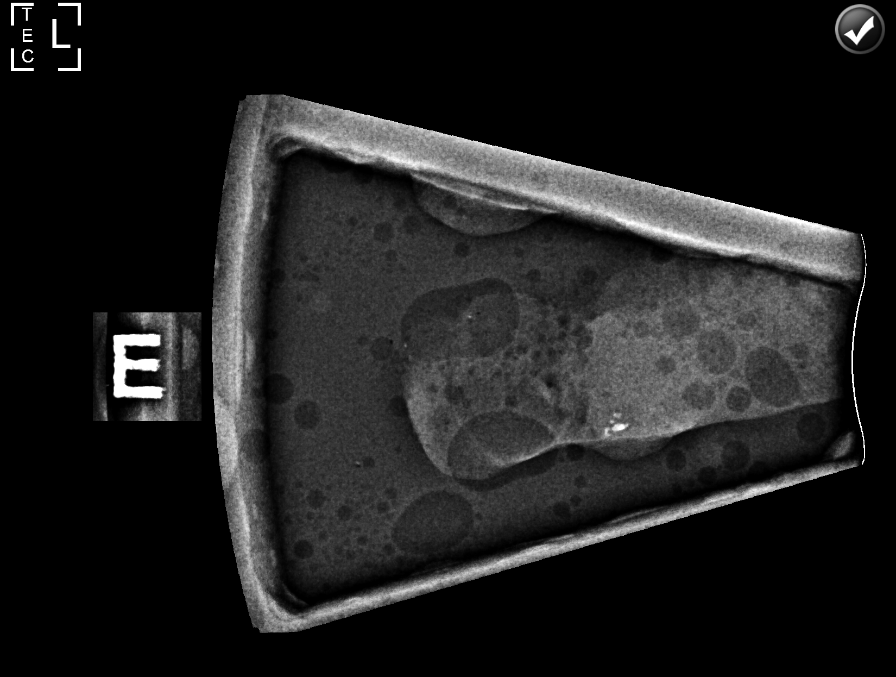

[L (4 of 8)]
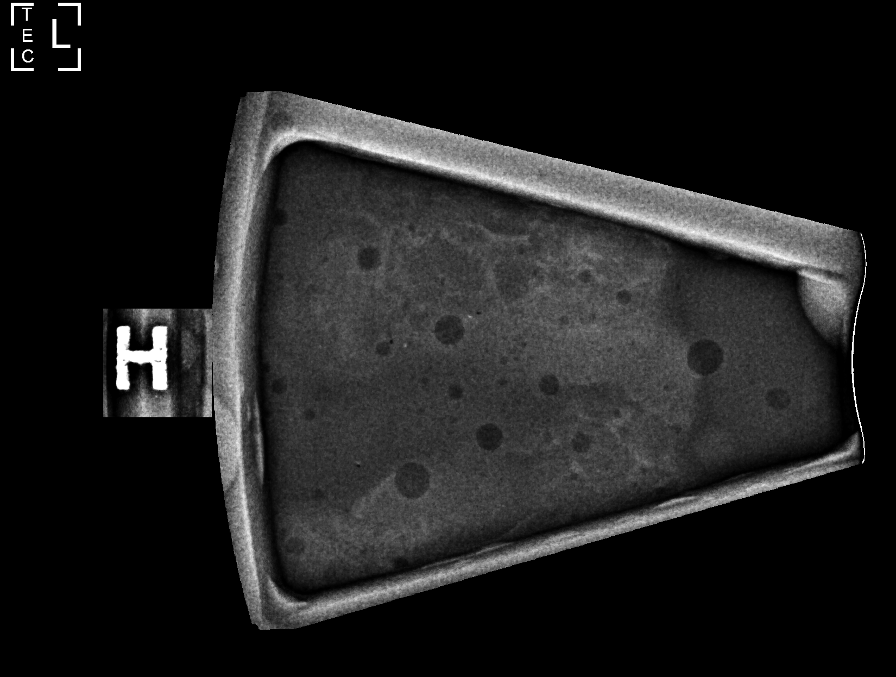

[L (5 of 8)]
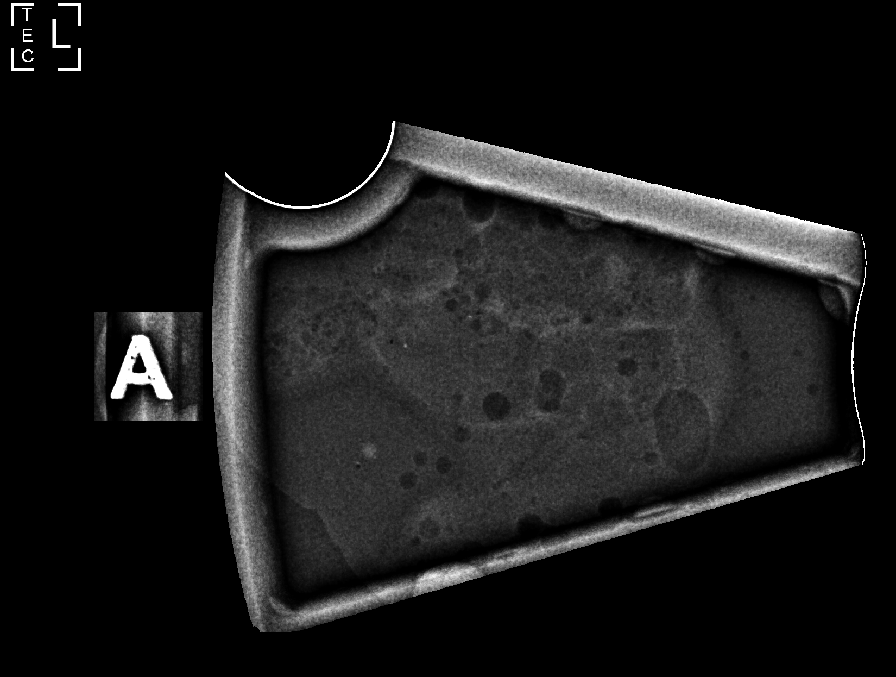

[L (6 of 8)]
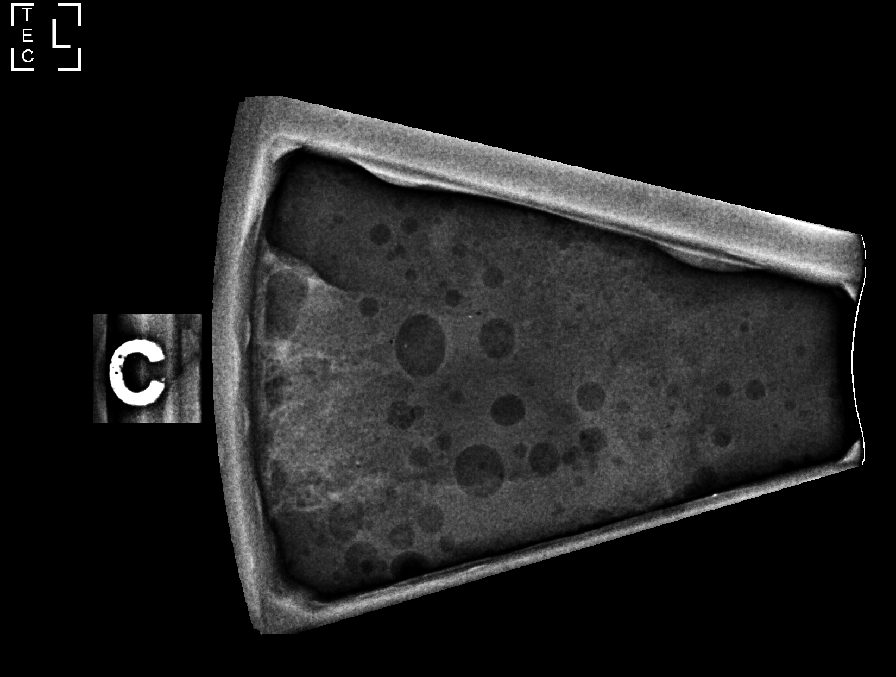

[L (7 of 8)]
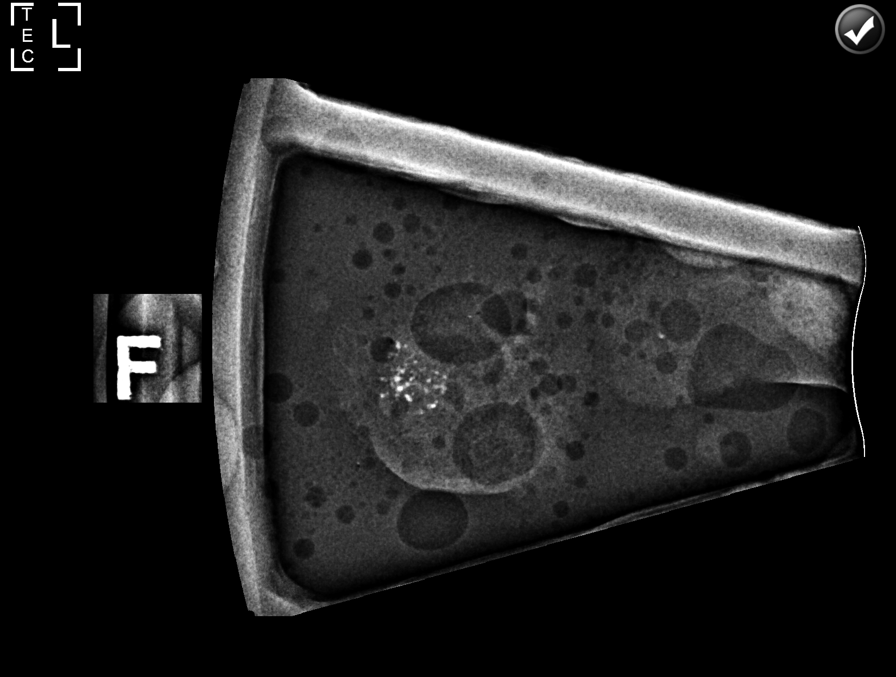

[L (8 of 8)]
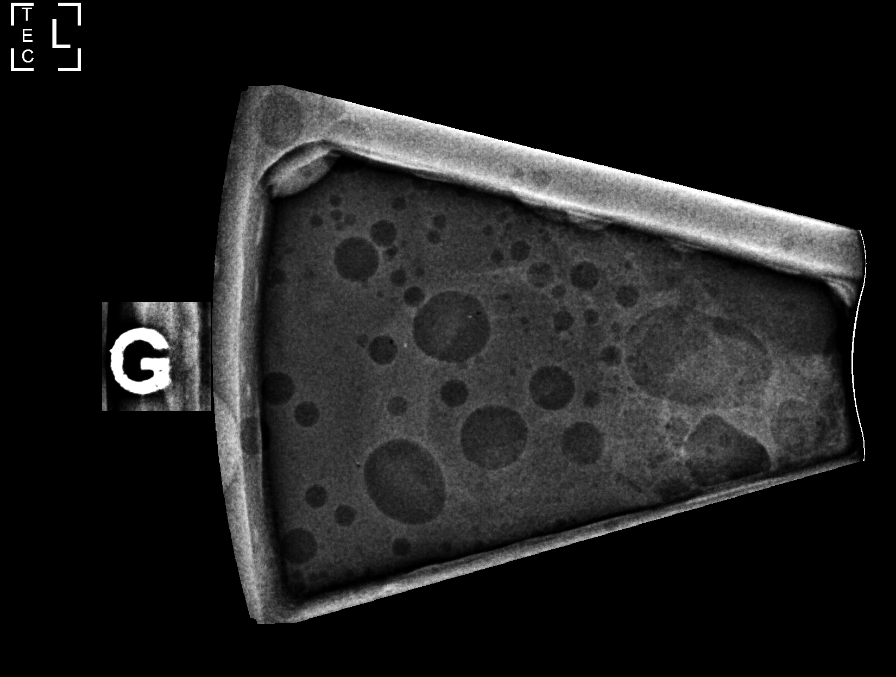

[8 of 21 positions shown; findings below may reference images not displayed]



Using sterile technique and 1% Lidocaine as local anesthetic, under
stereotactic guidance, a 9 gauge vacuum assisted device was used to
perform core needle biopsy of calcifications in the upper-outer
quadrant of the left breast using a superior approach. Specimen
radiograph was performed showing calcifications within the specimen.
Specimens with calcifications are identified for pathology.

Lesion quadrant: Upper outer quadrant

At the conclusion of the procedure, an X shaped tissue marker clip
was deployed into the biopsy cavity. Follow-up 2-view mammogram was
performed and dictated separately.
IMPRESSION: Stereotactic-guided biopsy of the left breast. No apparent
complications.

ADDENDUM:
Pathology revealed FLAT EPITHELIAL ATYPIA WITH CALCIFICATIONS,
FIBROCYSTIC CHANGES WITH CALCIFICATION of the LEFT breast, upper
outer quadrant, posterior, (X clip). This was found to be concordant
by Dr. JOSHJAX, with surgical consultation for consideration
of excision recommended.

Pathology results were discussed with the patient by telephone. The
patient reported doing well after the biopsy with tenderness at the
site. Post biopsy instructions and care were reviewed and questions
were answered. The patient was encouraged to call The [REDACTED]

Per patient request, surgical consultation has been arranged with
Dr. JOSHJAX at [REDACTED] on [DATE].

Pathology results reported by JOSHJAX RN on [DATE].



Using sterile technique and 1% Lidocaine as local anesthetic, under
stereotactic guidance, a 9 gauge vacuum assisted device was used to
perform core needle biopsy of calcifications in the upper-outer
quadrant of the left breast using a superior approach. Specimen
radiograph was performed showing calcifications within the specimen.
Specimens with calcifications are identified for pathology.

Lesion quadrant: Upper outer quadrant

At the conclusion of the procedure, an X shaped tissue marker clip
was deployed into the biopsy cavity. Follow-up 2-view mammogram was
performed and dictated separately.
IMPRESSION: Stereotactic-guided biopsy of the left breast. No apparent
complications.

## 2021-02-09 IMAGING — MG MM BREAST LOCALIZATION CLIP
4 series · 4 of 12 positions shown · non-contrast
Comparison: Previous exam(s).

CLINICAL DATA: Status post left breast stereotactic biopsy.

EXAM:
3D DIAGNOSTIC LEFT MAMMOGRAM POST STEREOTACTIC BIOPSY

[L ML synth-2D]
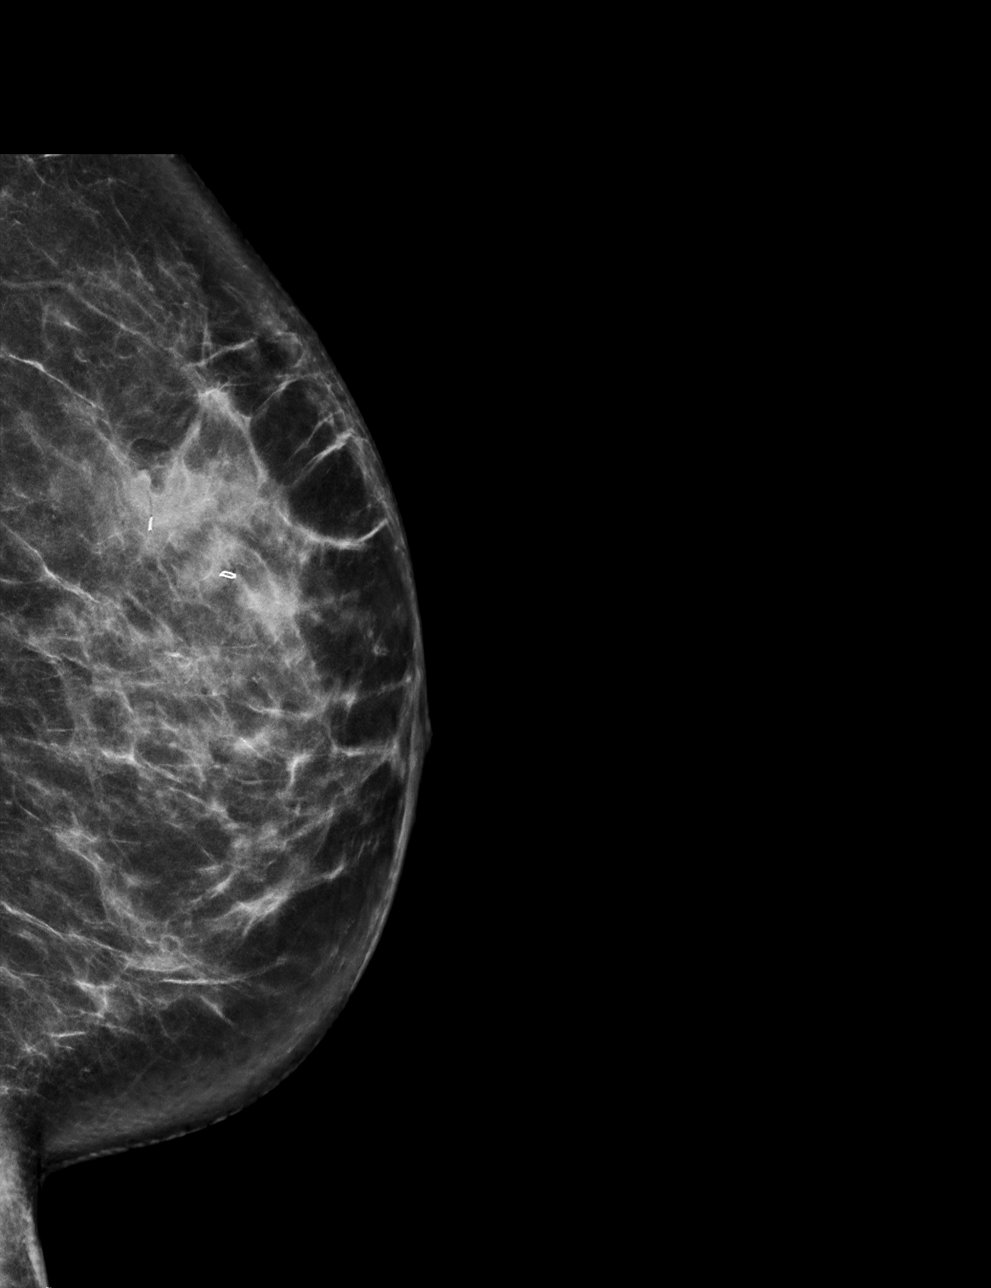

[L CC synth-2D]
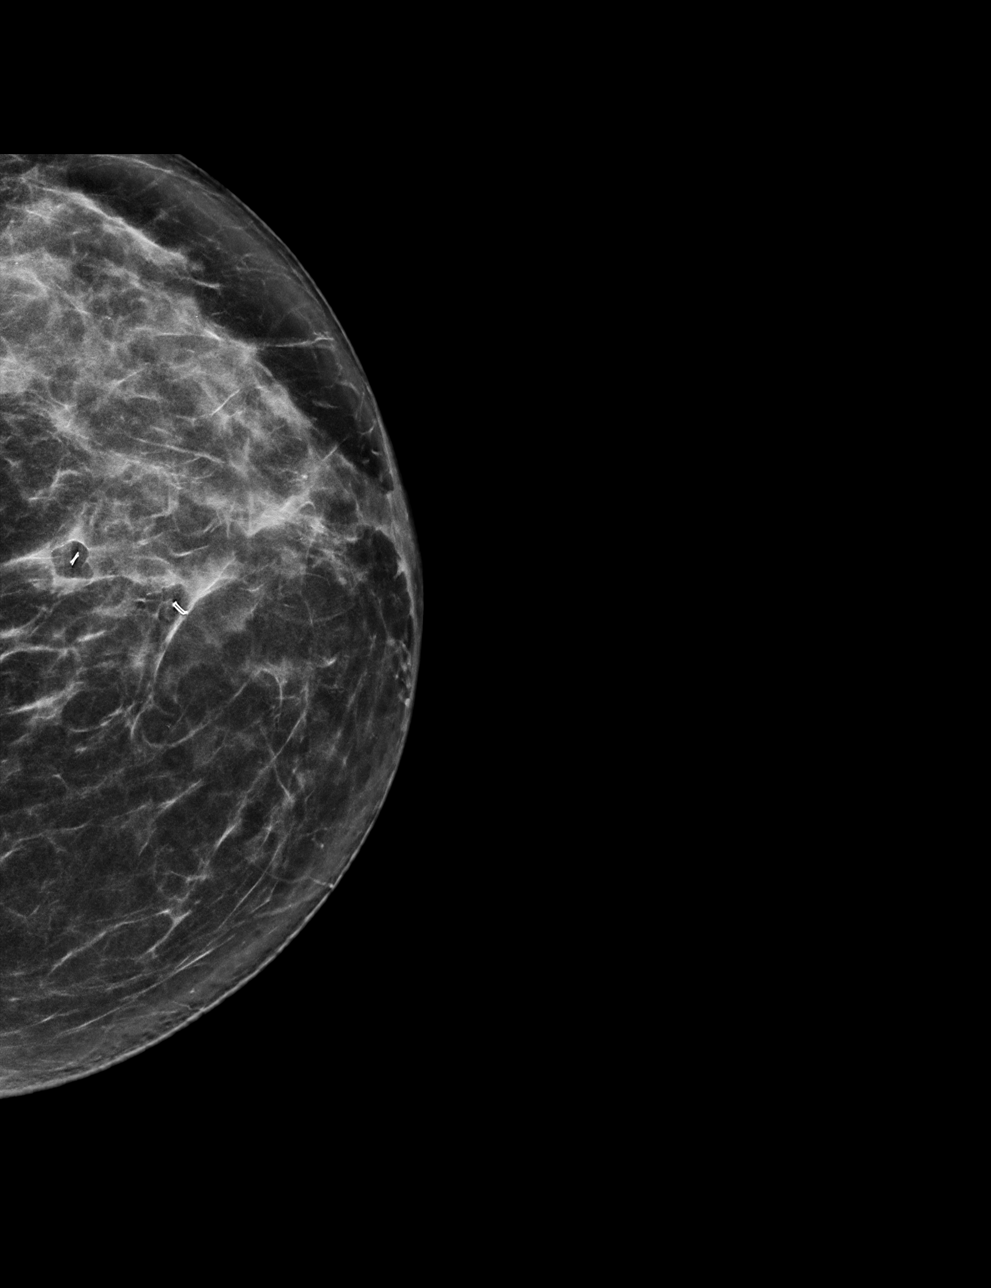

[L ML tomo · tomo slice 36/71.0]
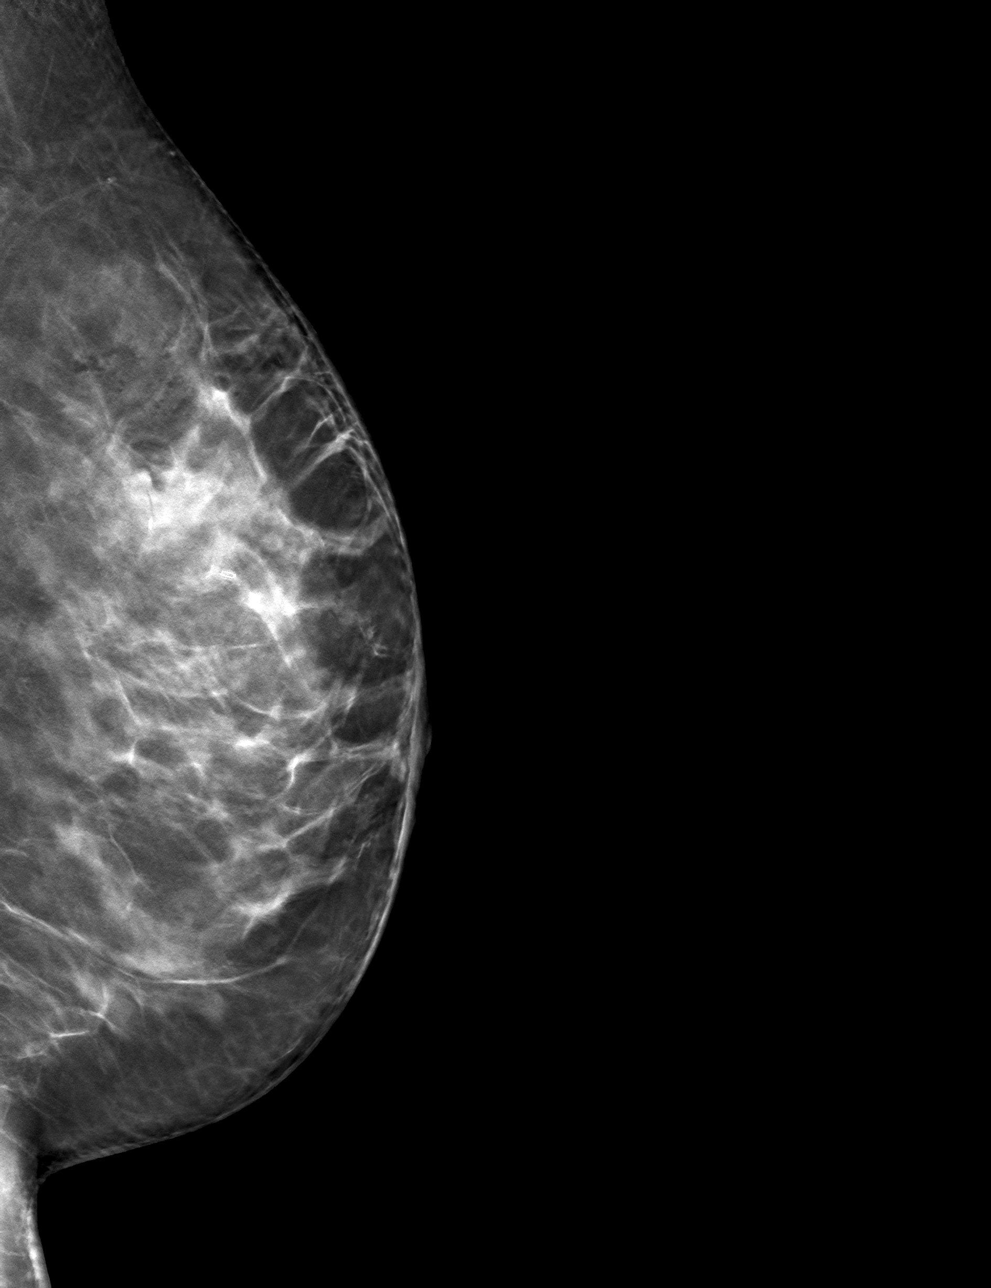

[L CC tomo · tomo slice 32/63.0]
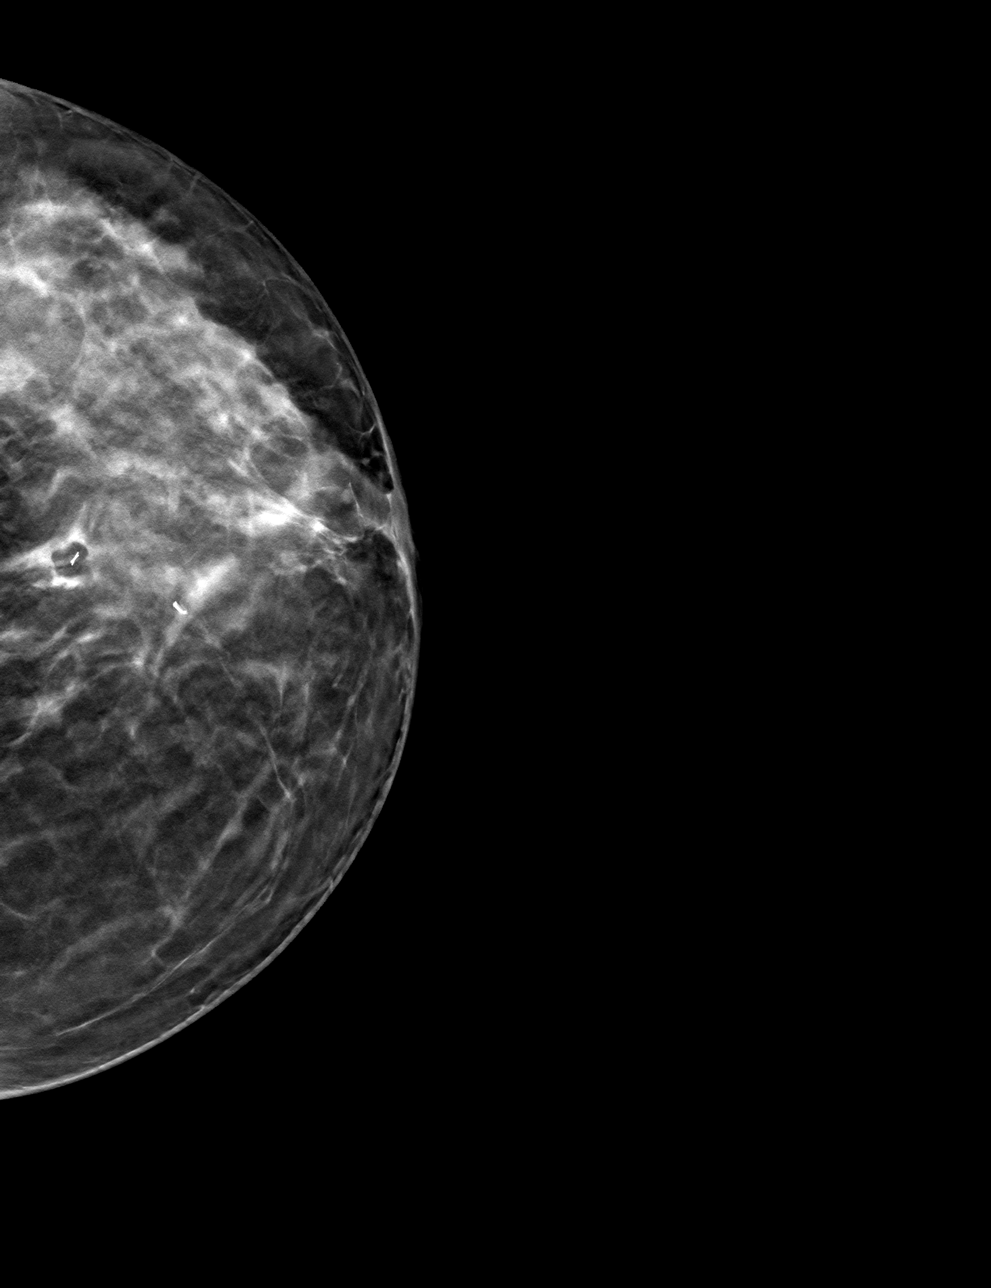

[4 of 12 positions shown; findings below may reference images not displayed]

FINDINGS: 3D Mammographic images were obtained following stereotactic guided
biopsy of the left breast. The biopsy marking clip is in expected
position at the site of biopsy.
IMPRESSION: Appropriate positioning of the X shaped biopsy marking clip at the
site of biopsy in the upper-outer left breast.

Final Assessment: Post Procedure Mammograms for Marker Placement

## 2021-07-06 ENCOUNTER — Other Ambulatory Visit: Payer: Self-pay | Admitting: General Surgery

## 2021-07-06 DIAGNOSIS — N6089 Other benign mammary dysplasias of unspecified breast: Secondary | ICD-10-CM

## 2021-07-27 ENCOUNTER — Other Ambulatory Visit: Payer: Self-pay | Admitting: Family Medicine

## 2021-07-28 ENCOUNTER — Other Ambulatory Visit: Payer: Self-pay | Admitting: Family Medicine

## 2021-07-28 DIAGNOSIS — Z8342 Family history of familial hypercholesterolemia: Secondary | ICD-10-CM

## 2021-08-04 ENCOUNTER — Other Ambulatory Visit: Payer: Self-pay

## 2021-08-04 ENCOUNTER — Ambulatory Visit
Admission: RE | Admit: 2021-08-04 | Discharge: 2021-08-04 | Disposition: A | Discharge status: Home / Self Care | Payer: No Typology Code available for payment source | Source: Ambulatory Visit | Attending: General Surgery | Admitting: General Surgery

## 2021-08-04 DIAGNOSIS — N6089 Other benign mammary dysplasias of unspecified breast: Secondary | ICD-10-CM

## 2021-08-04 IMAGING — MR MR BREAST WO/W CM  BILAT
5 series · 30 of 48 positions shown · IV contrast (gadavist)
Comparison: Mammography [DATE]

CLINICAL DATA: Intermediate risk for breast cancer. Flat epithelial
atypia biopsied in the left breast [DATE].

EXAM:
BILATERAL BREAST MRI WITH AND WITHOUT CONTRAST
TECHNIQUE: Multiplanar, multisequence MR images of both breasts were obtained
prior to and following the intravenous administration of 7 ml of
Gadavist

[Series 2: t2_tirm_tra ipat (a-p) · axial · 3.0mm · 0.70mm/px · z∈[-33,+105]mm · 5 of 47 slices shown]
[im 1/47]
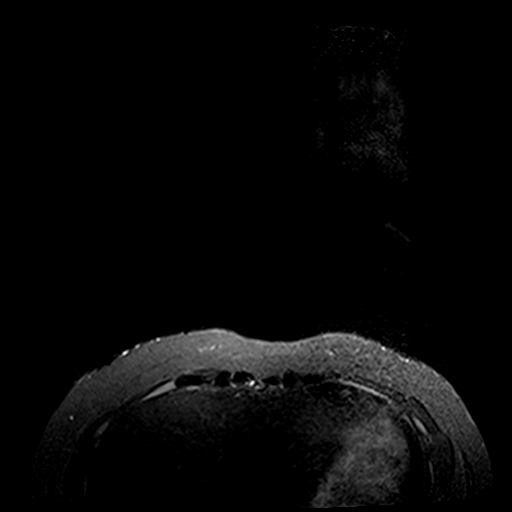
[im 12/47]
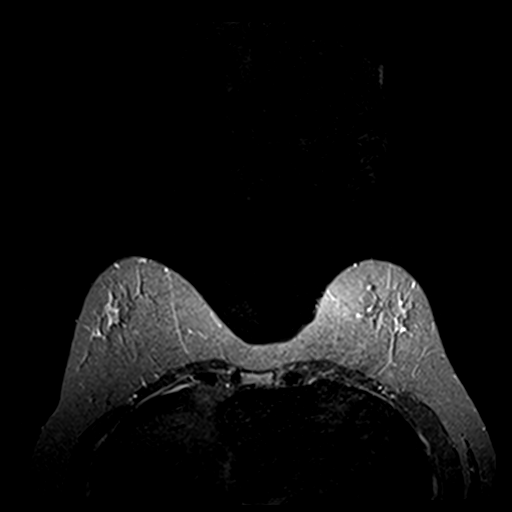
[im 24/47]
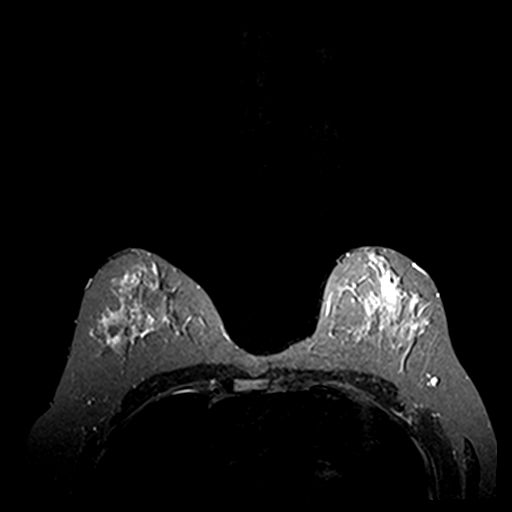
[im 35/47]
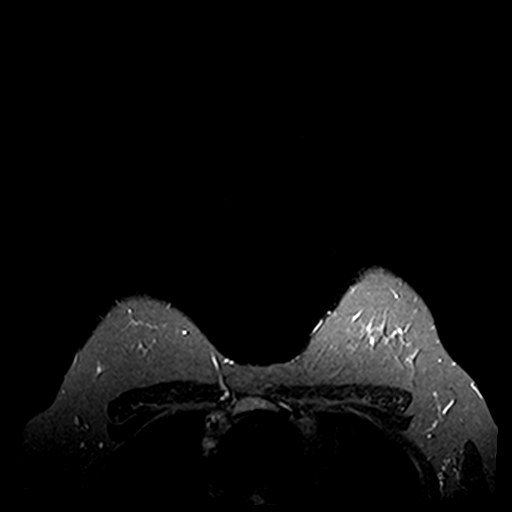
[im 47/47]
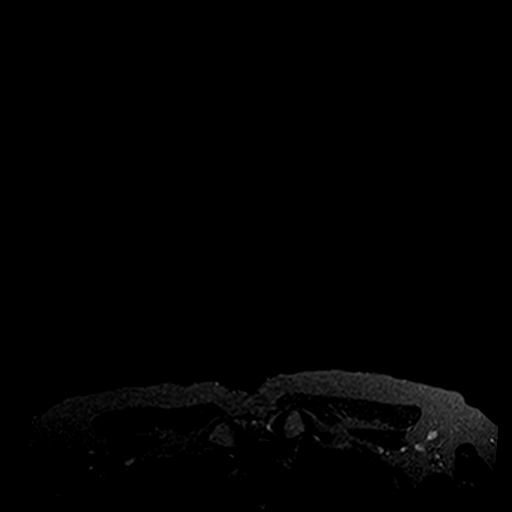

[Series 3: fl3d pre-cm · axial · non-contrast · 1.2mm · 0.94mm/px · z∈[-50,+121]mm · 8 of 144 slices shown]
[im 1/144]
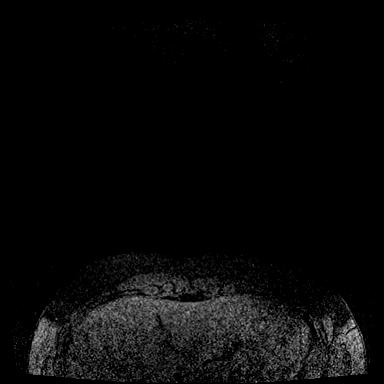
[im 23/144]
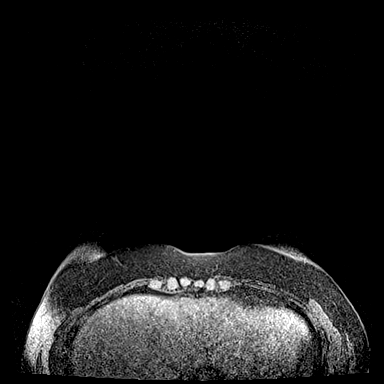
[im 45/144]
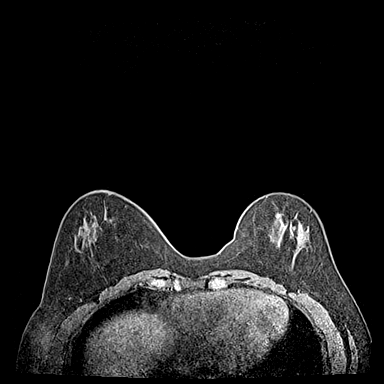
[im 67/144]
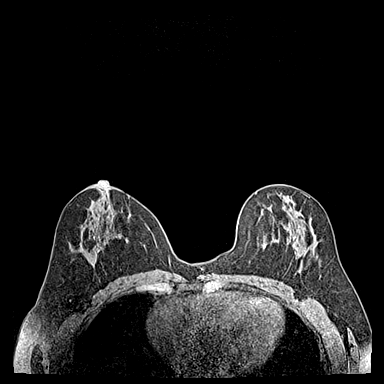
[im 78/144]
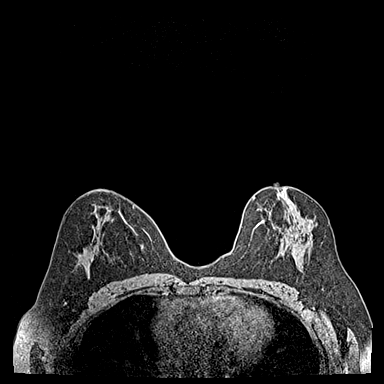
[im 100/144]
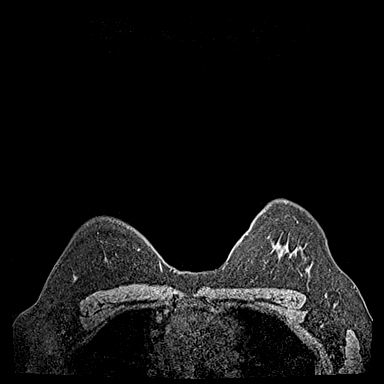
[im 122/144]
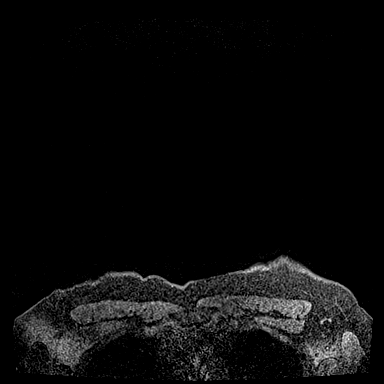
[im 144/144]
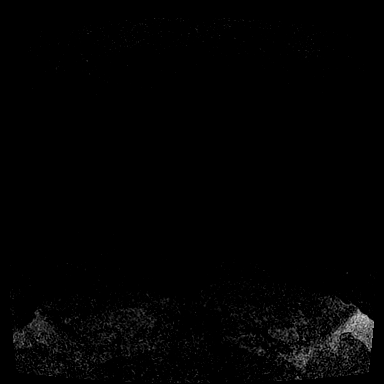

[Series 4: fl3d post-cm 20 · axial · 1.2mm · 0.94mm/px · z∈[-50,+121]mm · 8 of 144 slices shown (1 of 3)]
[im 1/144]
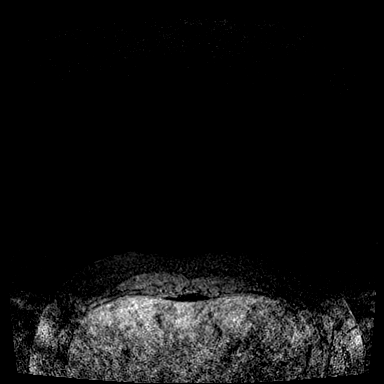
[im 23/144]
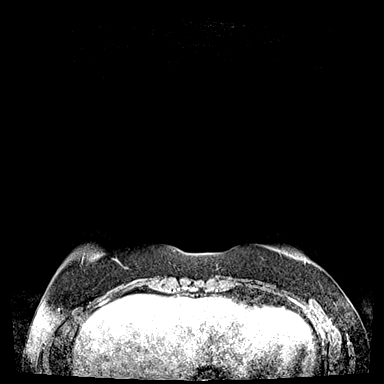
[im 45/144]
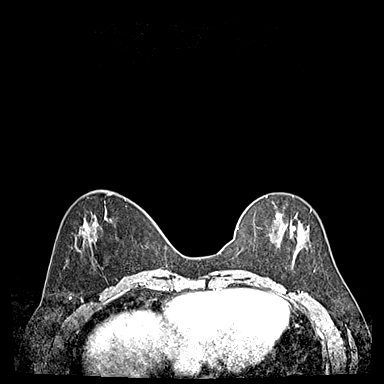
[im 67/144]
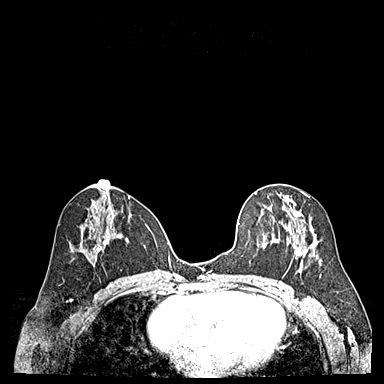
[im 78/144]
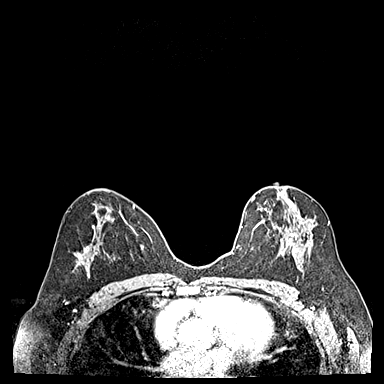
[im 100/144]
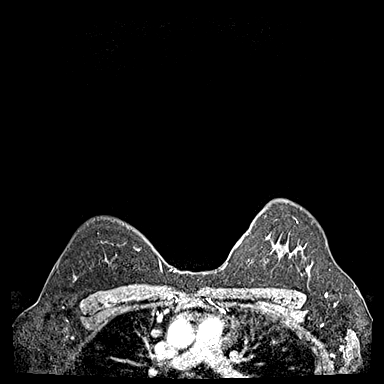
[im 122/144]
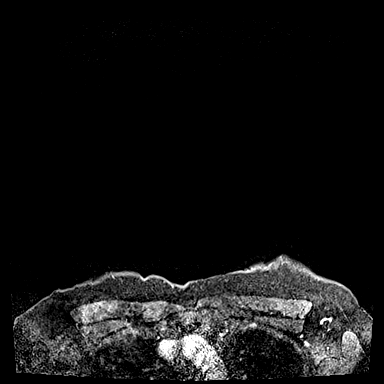
[im 144/144]
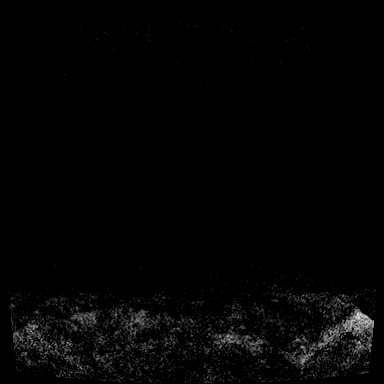

[Series 5: fl3d post-cm 20 · axial · 1.2mm · 0.94mm/px · z∈[-50,+121]mm · 8 of 144 slices shown (2 of 3)]
[im 1/144]
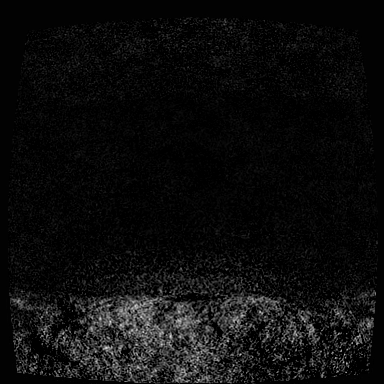
[im 23/144]
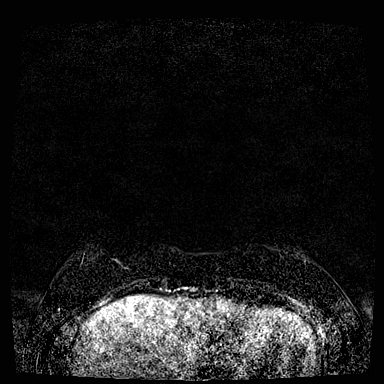
[im 45/144]
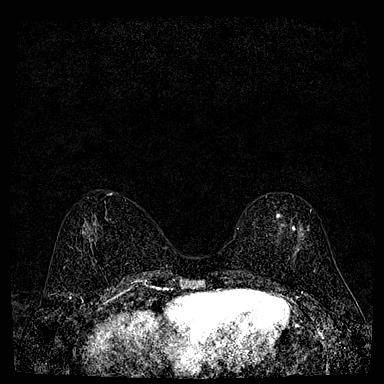
[im 67/144]
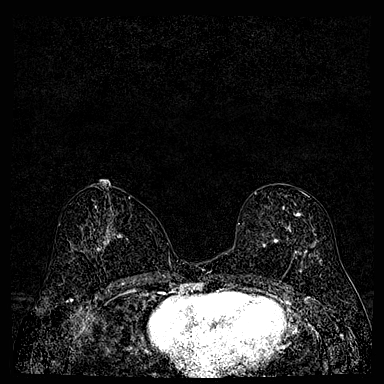
[im 78/144]
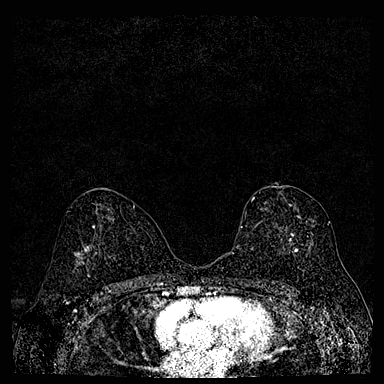
[im 100/144]
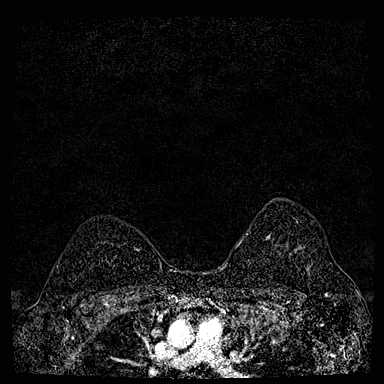
[im 122/144]
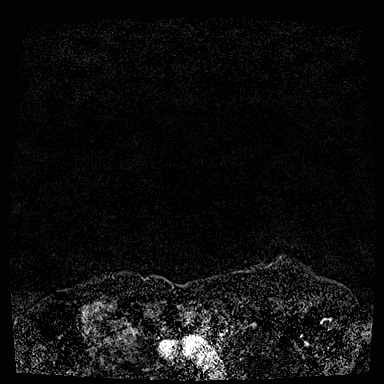
[im 144/144]
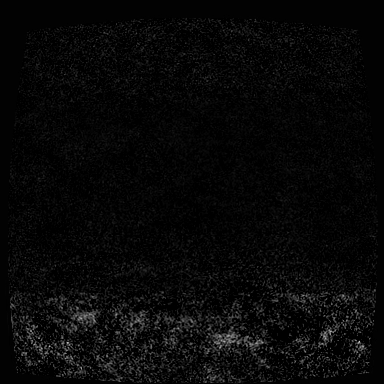

[Series 6: fl3d post-cm 20 · axial · 172.8mm · 0.94mm/px · 1 of 1 slices shown (3 of 3)]
[im 1/1]
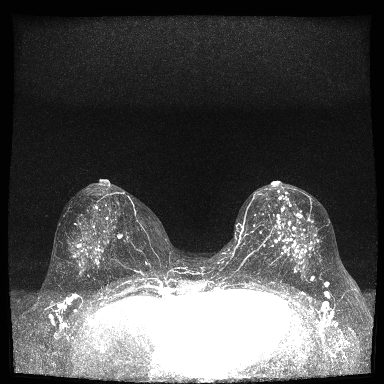

[30 of 48 positions shown; findings below may reference images not displayed]

Three-dimensional MR images were rendered by post-processing of the
original MR data on an independent workstation. The
three-dimensional MR images were interpreted, and findings are
reported in the following complete MRI report for this study. Three
dimensional images were evaluated at the independent interpreting
workstation using the DynaCAD thin client.
FINDINGS: Breast composition: c. Heterogeneous fibroglandular tissue.

Background parenchymal enhancement: Moderate.

Right breast: There is a mass in the slightly medial right breast at
3 o'clock identified on subtraction image 76 measuring 6 mm. No
other MRI evidence of malignancy in the right breast.

Left breast: The patient has 2 biopsy clips. Only 1 of these clips
is identified on today's MRI on series 3, image 59. Multiple foci
are identified throughout the left breast. Most are similar in
appearance. However, there is an irregular mass in the slightly
lateral left breast at 3 o'clock measuring 7 mm on subtraction image
63. No other MRI evidence of malignancy in the left breast.

Lymph nodes: No abnormal appearing lymph nodes.

Ancillary findings:  None.
IMPRESSION: 1. 6 mm right breast mass at 3 o'clock on subtraction image 76.
2. 7 mm irregular mass at 3 o'clock in the left breast on
subtraction image 63.

RECOMMENDATION:
Recommended MRI guided biopsy of the 3 o'clock right breast mass and
the 3 o'clock left breast mass.

BI-RADS CATEGORY  4: Suspicious.

## 2021-08-04 MED ORDER — GADOBUTROL 1 MMOL/ML IV SOLN
7.0000 mL | Freq: Once | INTRAVENOUS | Status: AC | PRN
  Administered 2021-08-04: 7 mL via INTRAVENOUS

## 2021-08-05 ENCOUNTER — Other Ambulatory Visit: Payer: Self-pay | Admitting: General Surgery

## 2021-08-05 DIAGNOSIS — R9389 Abnormal findings on diagnostic imaging of other specified body structures: Secondary | ICD-10-CM

## 2021-08-10 ENCOUNTER — Other Ambulatory Visit: Payer: Self-pay

## 2021-08-19 ENCOUNTER — Other Ambulatory Visit: Payer: Self-pay

## 2021-08-21 ENCOUNTER — Ambulatory Visit
Admission: RE | Admit: 2021-08-21 | Discharge: 2021-08-21 | Disposition: A | Discharge status: Home / Self Care | Payer: No Typology Code available for payment source | Source: Ambulatory Visit | Attending: Family Medicine | Admitting: Family Medicine

## 2021-08-21 DIAGNOSIS — Z8342 Family history of familial hypercholesterolemia: Secondary | ICD-10-CM

## 2021-08-21 IMAGING — CT CT CARDIAC CORONARY ARTERY CALCIUM SCORE
3 series · 14 of 20 positions shown, 16 images · non-contrast
Comparison: None

CLINICAL DATA: A 41-year-old Asian female presents for coronary
artery calcium scoring.

EXAM:
CT CARDIAC CORONARY ARTERY CALCIUM SCORE
TECHNIQUE: Non-contrast imaging through the heart was performed using
prospective ECG gating. Image post processing was performed on an
independent workstation, allowing for quantitative analysis of the
heart and coronary arteries. Note that this exam targets the heart
and the chest was not imaged in its entirety.

[Series 2: calcium scoring 2.00 qr36 bestdiast 71% hrt calciu · axial · 0.34mm/px · z∈[+1563,+1635]mm · 4 of 60 slices shown]
[im 12/60  vessel]
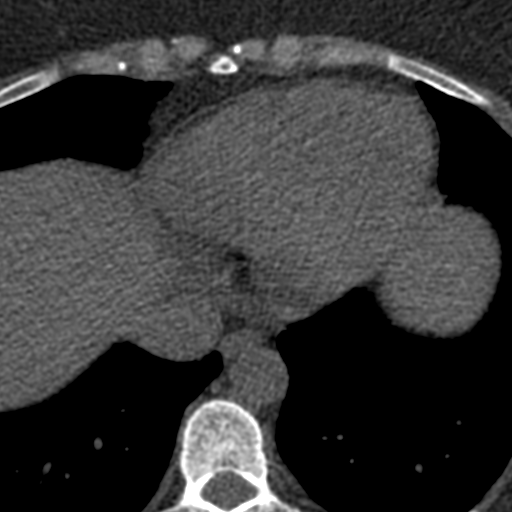
[im 24/60  vessel]
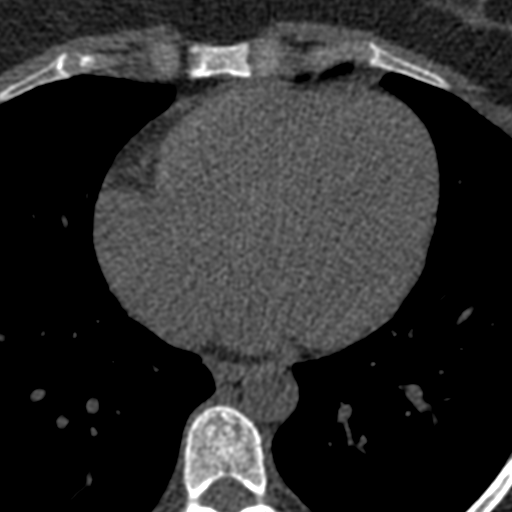
[im 36/60  vessel]
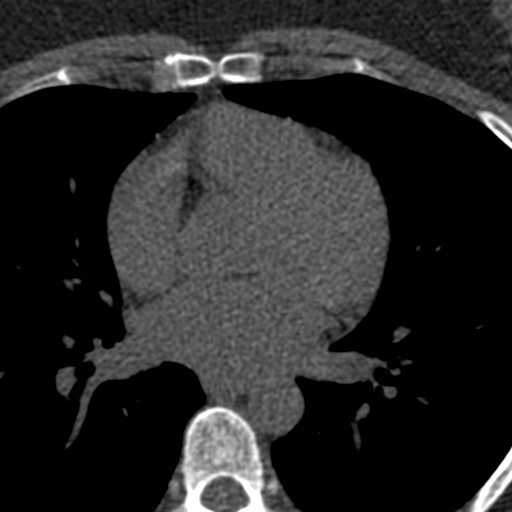
[im 48/60  vessel]
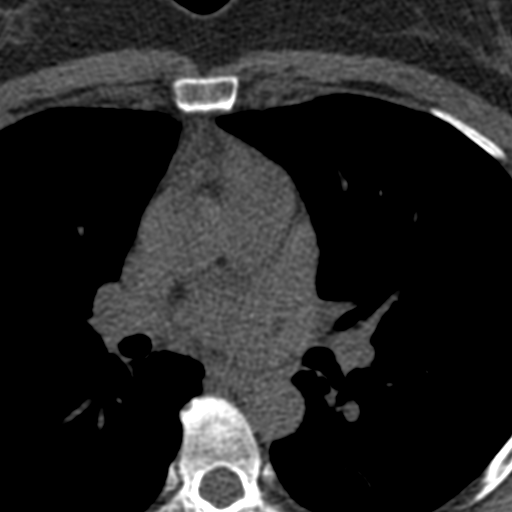

[Series 3: calcium scoring 2.00 br40 bestdiast 71% axial · axial · 0.53mm/px · z∈[+1559,+1639]mm · 5 of 60 slices shown, 7 images]
[im 10/60  vessel]
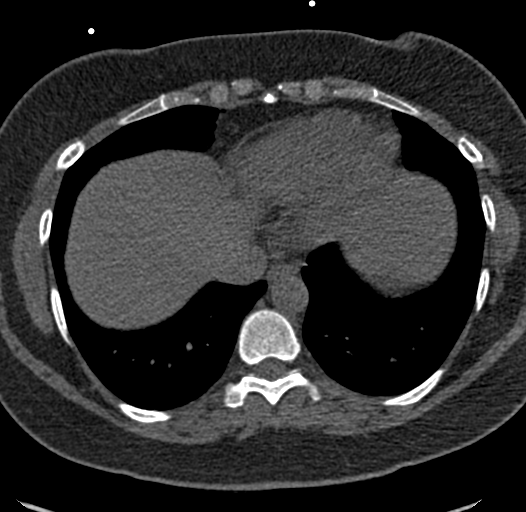
[im 10/60  lung]
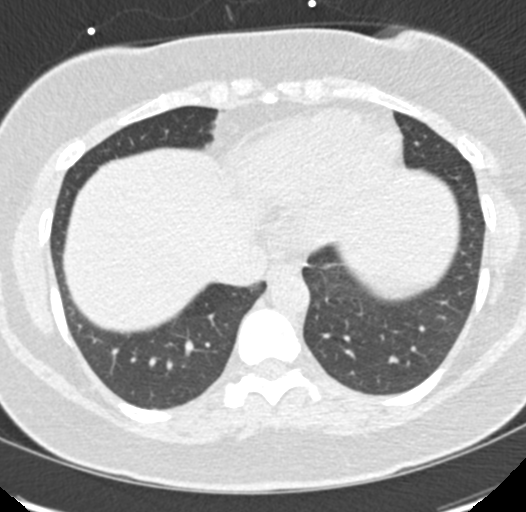
[im 20/60  vessel]
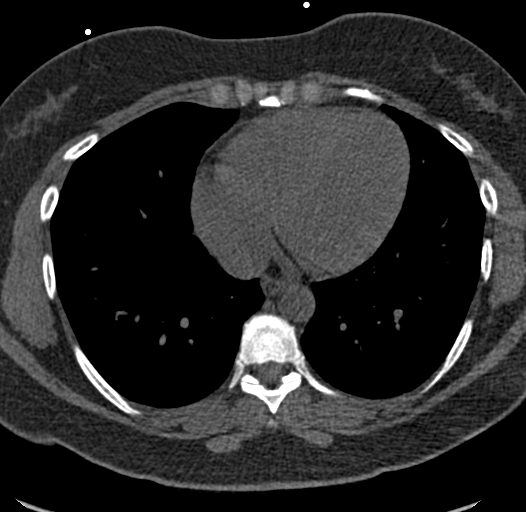
[im 30/60  vessel]
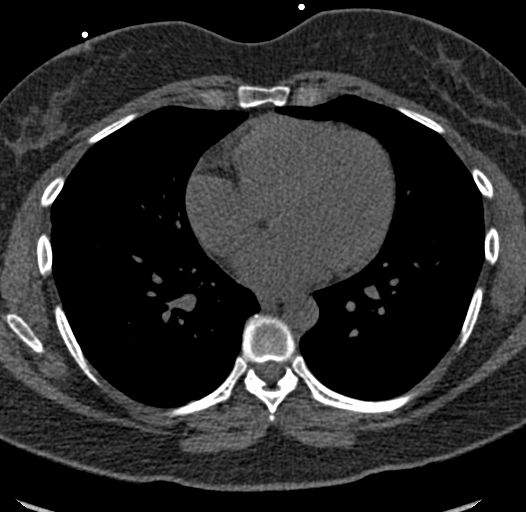
[im 40/60  vessel]
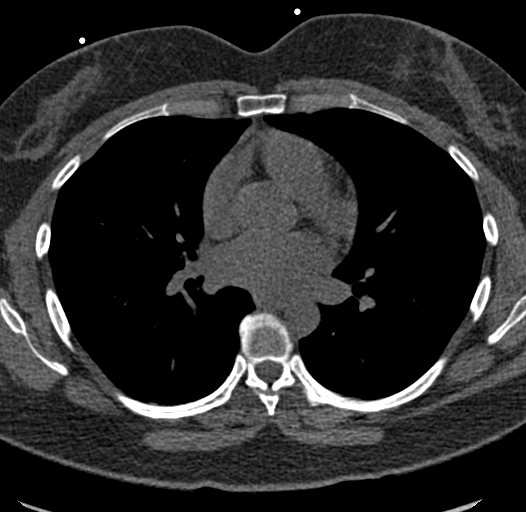
[im 50/60  vessel]
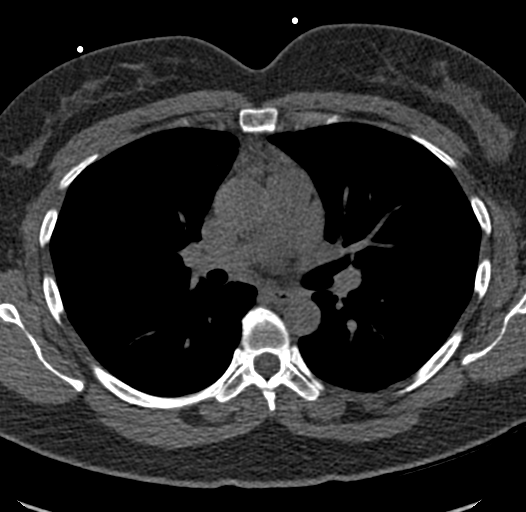
[im 50/60  lung]
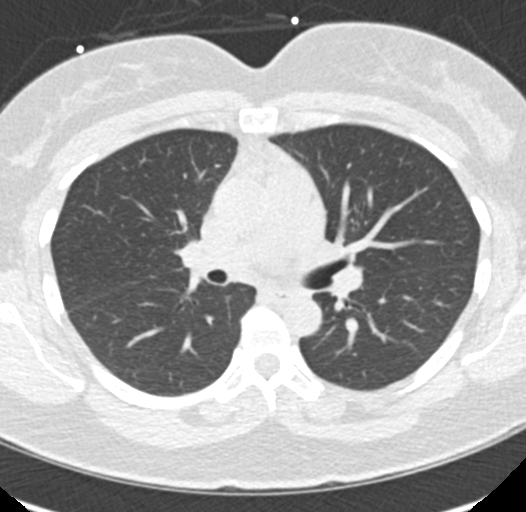

[Series 9: calcium scoring 2.00 br60 bestdiast 71% lungs · axial · 0.51mm/px · z∈[+1559,+1639]mm · 5 of 60 slices shown]
[im 10/60  vessel]
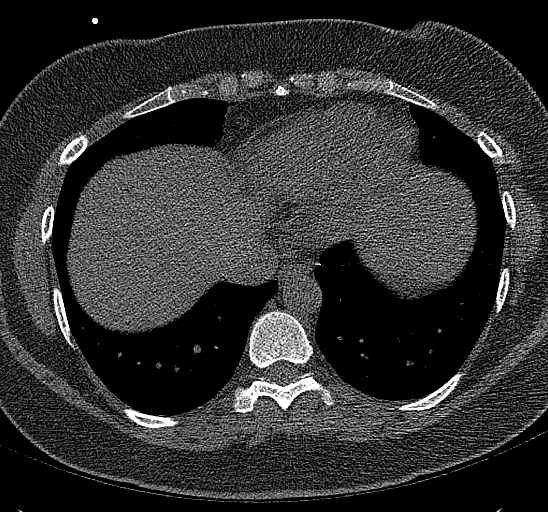
[im 20/60  vessel]
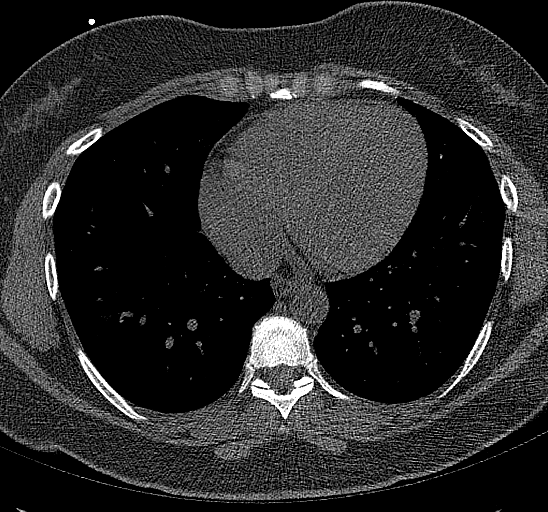
[im 30/60  vessel]
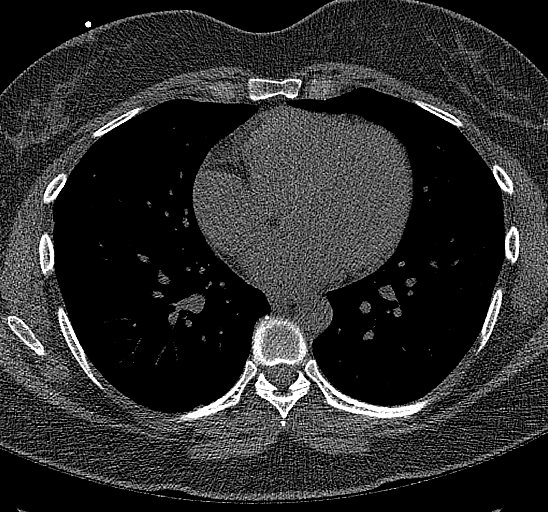
[im 40/60  vessel]
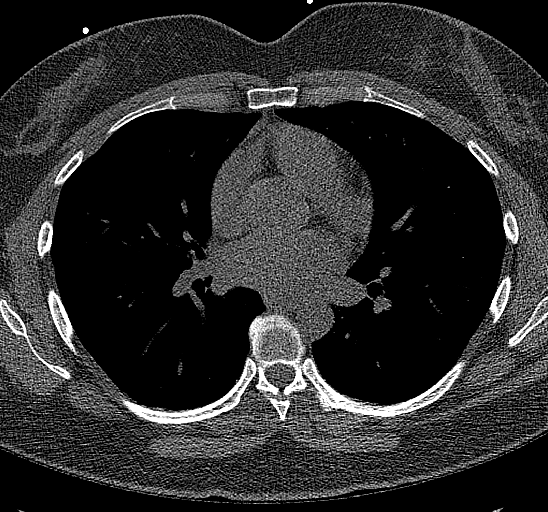
[im 50/60  vessel]
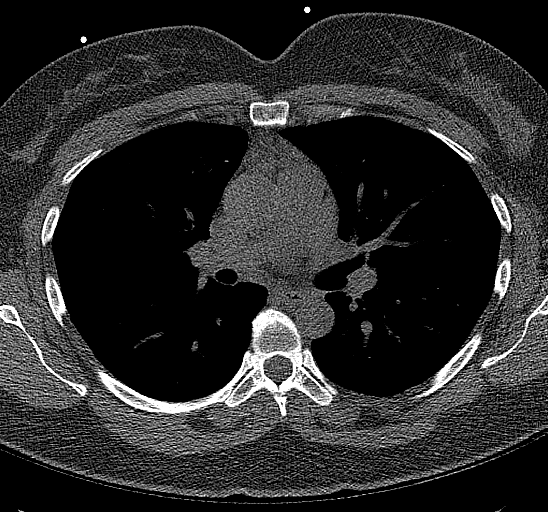

[14 of 20 positions shown; findings below may reference images not displayed]

FINDINGS: CORONARY CALCIUM SCORES:

Left Main: 0

LAD: 0

LCx: 0

RCA: 0

Total Agatston Score: 0

[HOSPITAL] percentile: 0

AORTA MEASUREMENTS:

Ascending Aorta: 27 mm

Descending Aorta: 20 mm

OTHER FINDINGS:

Cardiovascular: Unremarkable noncontrast appearance of the heart and
great vessels.

Mediastinum/Nodes: Imaged portions of the mediastinum without signs
of adenopathy or acute process.

Lungs/Pleura: Visualized lungs are clear and airways are patent.

Upper Abdomen: Imaged portions of the upper abdomen are unremarkable
on limited assessment.

Musculoskeletal: No acute or destructive bone findings.
IMPRESSION: Coronary artery calcium score of 0. No evidence of calcified
coronary artery disease.

## 2021-09-07 ENCOUNTER — Ambulatory Visit
Admission: RE | Admit: 2021-09-07 | Discharge: 2021-09-07 | Disposition: A | Discharge status: Home / Self Care | Payer: 59 | Source: Ambulatory Visit | Attending: General Surgery | Admitting: General Surgery

## 2021-09-07 DIAGNOSIS — R9389 Abnormal findings on diagnostic imaging of other specified body structures: Secondary | ICD-10-CM

## 2021-09-07 IMAGING — MR MR BREAST BX W LOC DEV 1ST LESION IMAGE BX SPEC MR GUIDE*L*
7 of 10 series · 31 of 48 positions shown · IV contrast (7ml gadavist)
Comparison: Previous exams.
COMPARISON: Previous exams.

Addendum:
CLINICAL DATA: 41-year-old female for MR guided biopsy of a 0.7 cm
central LEFT breast mass and MR guided biopsy of a 0.6 cm central
RIGHT breast mass.

EXAM:
MRI GUIDED CORE NEEDLE BIOPSY OF THE LEFT BREAST
MRI GUIDED CORE NEEDLE BIOPSY OF THE RIGHT BREAST
TECHNIQUE: Multiplanar, multisequence MR imaging of bilateral breasts was
performed both before and after administration of intravenous
contrast.
CONTRAST:  7mL GADAVIST GADOBUTROL 1 MMOL/ML IV SOLN

[Series 2: fiducial bilateral · sagittal · 2.0mm · 1.33mm/px · 3 of 128 slices shown]
[im 1/128]
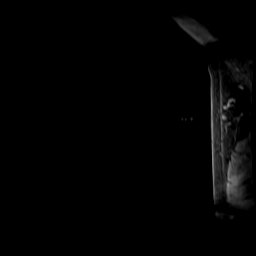
[im 64/128]
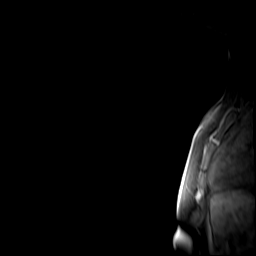
[im 128/128]
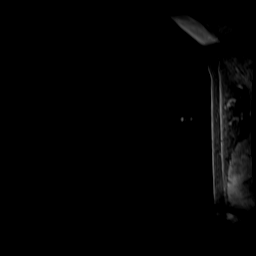

[Series 3: dynamic pre · axial · non-contrast · 1.3mm · 0.73mm/px · z∈[-152,+97]mm · 5 of 192 slices shown]
[im 1/192]
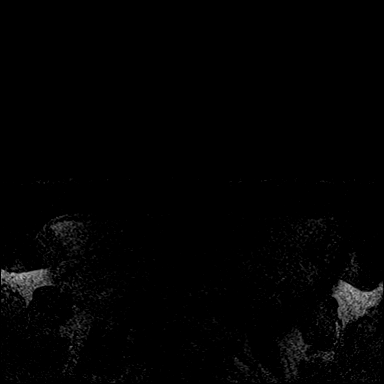
[im 48/192]
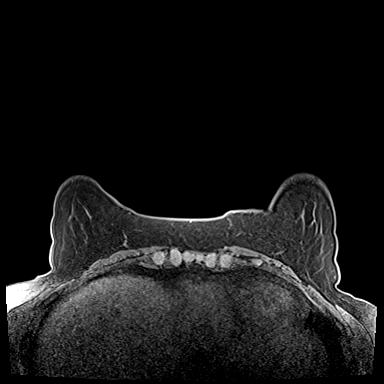
[im 96/192]
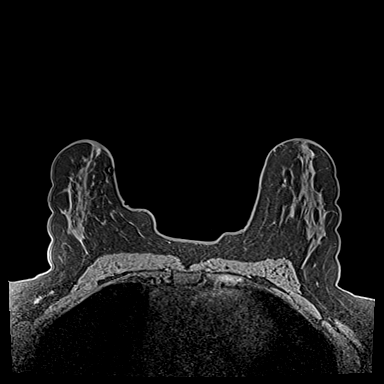
[im 144/192]
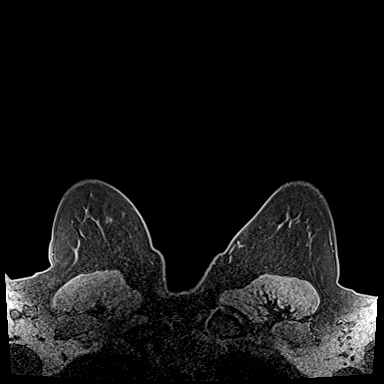
[im 192/192]
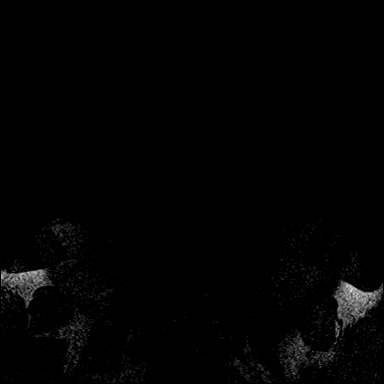

[Series 4: dynamic post 20 · axial · 1.3mm · 0.73mm/px · z∈[-152,+97]mm · 5 of 192 slices shown (1 of 2)]
[im 1/192]
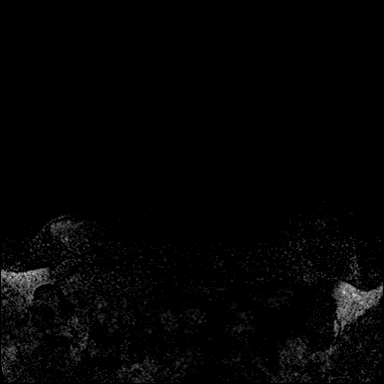
[im 48/192]
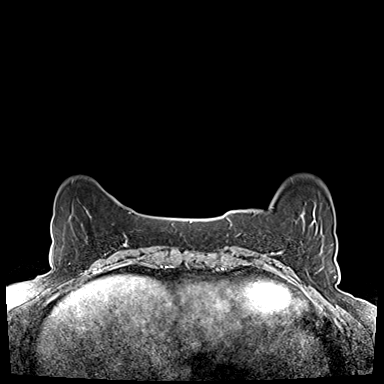
[im 96/192]
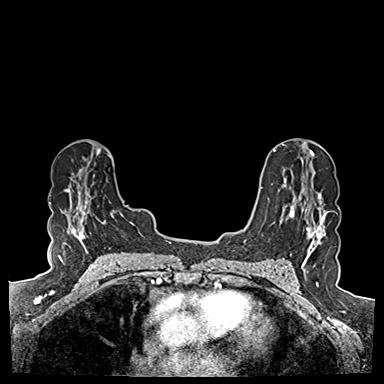
[im 144/192]
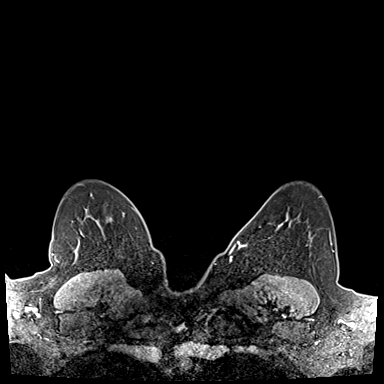
[im 192/192]
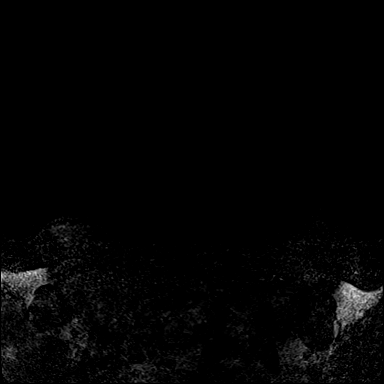

[Series 5: dynamic post 20 · axial · 1.3mm · 0.73mm/px · z∈[-152,+97]mm · 5 of 192 slices shown (2 of 2)]
[im 1/192]
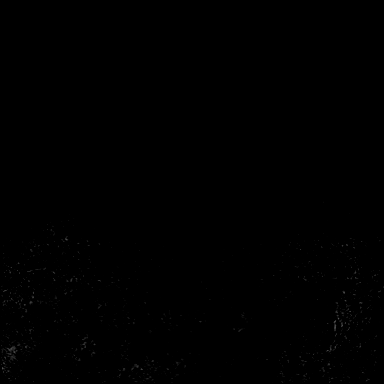
[im 48/192]
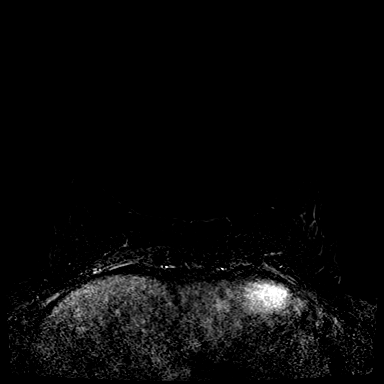
[im 96/192]
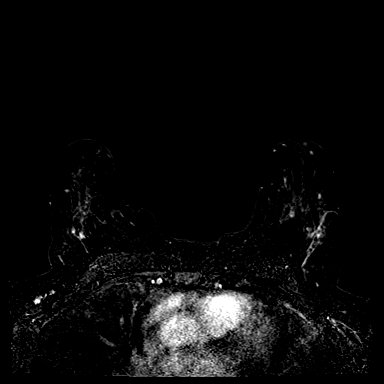
[im 144/192]
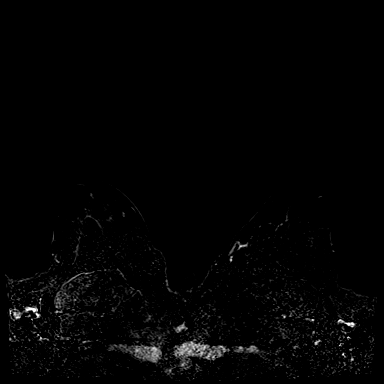
[im 192/192]
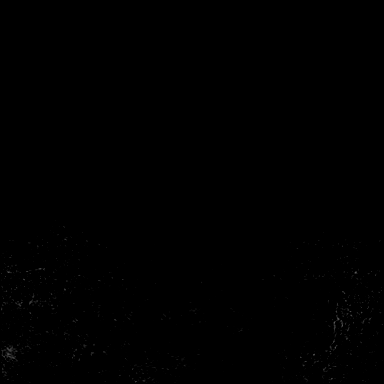

[Series 6: dynamic post 3 · axial · 1.3mm · 0.73mm/px · z∈[-152,+97]mm · 5 of 192 slices shown (1 of 2)]
[im 1/192]
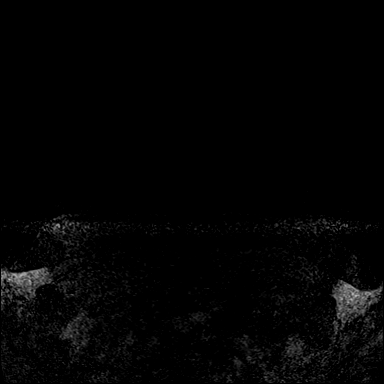
[im 48/192]
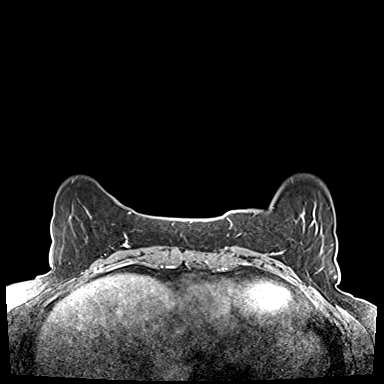
[im 96/192]
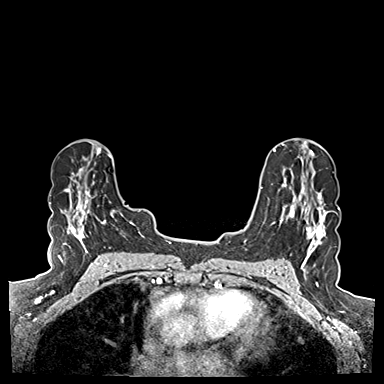
[im 144/192]
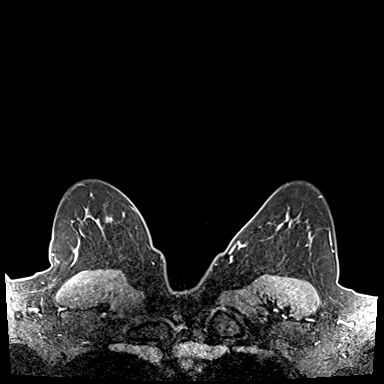
[im 192/192]
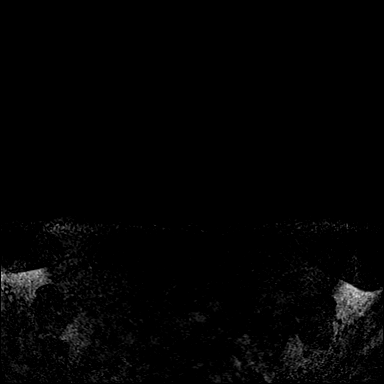

[Series 7: dynamic post 3 · axial · 1.3mm · 0.73mm/px · z∈[-152,+97]mm · 5 of 192 slices shown (2 of 2)]
[im 1/192]
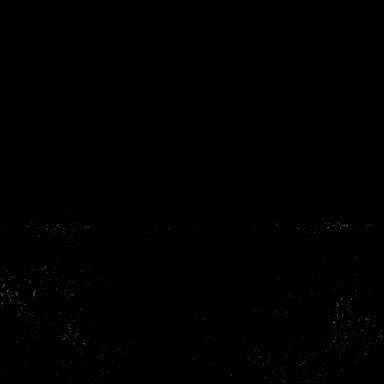
[im 48/192]
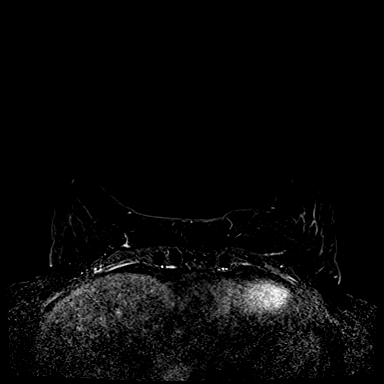
[im 96/192]
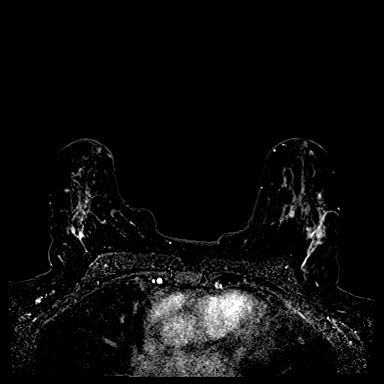
[im 144/192]
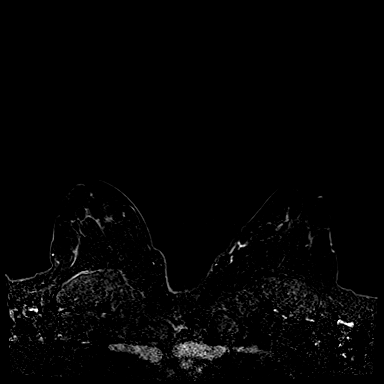
[im 192/192]
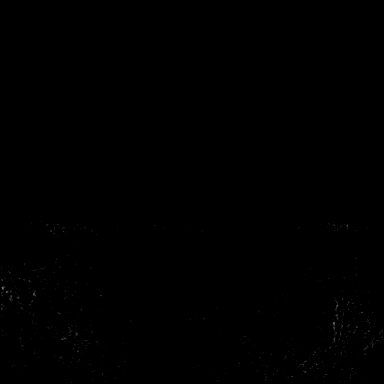

[Series 8: needle confirmation · axial · 1.3mm · 0.73mm/px · z∈[-152,-28]mm · 3 of 192 slices shown]
[im 1/192]
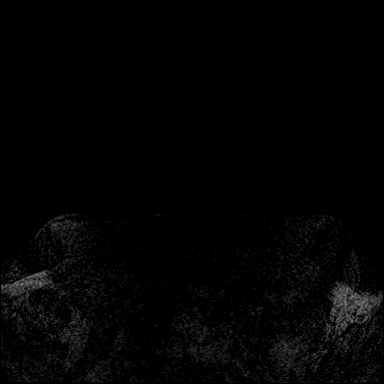
[im 48/192]
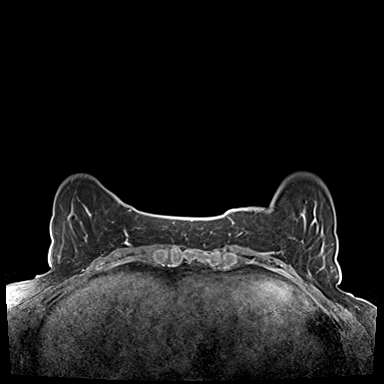
[im 96/192]
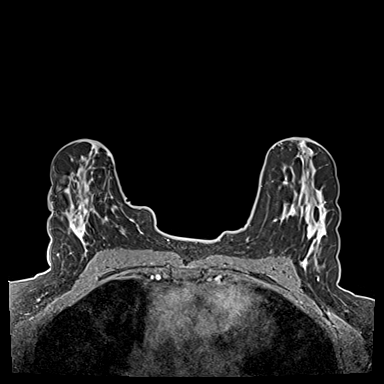

[31 of 48 positions shown; findings below may reference images not displayed]

FINDINGS: I met with the patient, and we discussed the procedure of MRI guided
biopsies, including risks, benefits, and alternatives. Specifically,
we discussed the risks of infection, bleeding, tissue injury, clip
migration, and inadequate sampling. Informed, written consent was
given. The usual time out protocol was performed immediately prior
to the procedures.

MRI GUIDED CORE NEEDLE BIOPSY OF THE LEFT BREAST

Using sterile technique, 1% Lidocaine with and without epinephrine,
MRI guidance, and a 9 gauge vacuum assisted device, biopsy was
performed of the 0.7 cm central LEFT breast mass using a LATERAL
approach. At the conclusion of the procedure, a BARBELL tissue
marker clip was deployed into the biopsy cavity. Follow-up 2-view
mammogram was performed and dictated separately.

MRI GUIDED CORE NEEDLE BIOPSY OF THE RIGHT BREAST

Using sterile technique, 1% Lidocaine with and without epinephrine,
MRI guidance, and a 9 gauge vacuum assisted device, biopsy was
performed of the 0.6 cm central LEFT breast mass using a LATERAL
approach. At the conclusion of the procedure, a BARBELL tissue
marker clip was deployed into the biopsy cavity. Follow-up 2-view
mammogram was performed and dictated separately.
IMPRESSION: MRI guided biopsy of 0.7 cm central LEFT breast mass.

MR guided biopsy of 0.6 cm central RIGHT breast mass.

No apparent complications.

ADDENDUM:
Pathology revealed INTRADUCTAL PAPILLOMA, FIBROADENOMATOID NODULE of
the LEFT breast, central, (barbell clip). This was found to be
concordant by Dr. DELAY, with surgical consultation for
consideration of excision recommended.

Pathology revealed COLUMNAR CELL HYPERPLASIA AND DUCT ECTASIA of the
RIGHT breast, central, (barbell clip). This was found to be
concordant by Dr. DELAY.

Pathology results were discussed with the patient by telephone. The
patient reported doing well after the biopsies with tenderness at
the sites. Post biopsy instructions and care were reviewed and
questions were answered. The patient was encouraged to call The

Surgical consultation was previously arranged with Dr. DELAY
DELAY at [REDACTED] on [DATE] for biopsy
proven FLAT EPITHELIAL ATYPIA WITH CALCIFICATIONS, FIBROCYSTIC
CHANGES WITH CALCIFICATION of the LEFT breast, upper outer quadrant,
posterior, (x clip).

The patient was instructed to return for a bilateral breast MRI in 6
months, per protocol.

Pathology results reported by DELAY, RN on [DATE].

*** End of Addendum ***
FINDINGS: I met with the patient, and we discussed the procedure of MRI guided
biopsies, including risks, benefits, and alternatives. Specifically,
we discussed the risks of infection, bleeding, tissue injury, clip
migration, and inadequate sampling. Informed, written consent was
given. The usual time out protocol was performed immediately prior
to the procedures.

MRI GUIDED CORE NEEDLE BIOPSY OF THE LEFT BREAST

Using sterile technique, 1% Lidocaine with and without epinephrine,
MRI guidance, and a 9 gauge vacuum assisted device, biopsy was
performed of the 0.7 cm central LEFT breast mass using a LATERAL
approach. At the conclusion of the procedure, a BARBELL tissue
marker clip was deployed into the biopsy cavity. Follow-up 2-view
mammogram was performed and dictated separately.

MRI GUIDED CORE NEEDLE BIOPSY OF THE RIGHT BREAST

Using sterile technique, 1% Lidocaine with and without epinephrine,
MRI guidance, and a 9 gauge vacuum assisted device, biopsy was
performed of the 0.6 cm central LEFT breast mass using a LATERAL
approach. At the conclusion of the procedure, a BARBELL tissue
marker clip was deployed into the biopsy cavity. Follow-up 2-view
mammogram was performed and dictated separately.
IMPRESSION: MRI guided biopsy of 0.7 cm central LEFT breast mass.

MR guided biopsy of 0.6 cm central RIGHT breast mass.

No apparent complications.

## 2021-09-07 IMAGING — MG MM BREAST LOCALIZATION CLIP
4 series · 4 of 12 positions shown · non-contrast
Comparison: Previous exam(s).

CLINICAL DATA: Evaluate placement of BARBELL biopsy clips following
MR guided LEFT breast biopsy and MR guided RIGHT breast biopsy.

EXAM:
3D DIAGNOSTIC BILATERAL MAMMOGRAM POST MRI BIOPSY

[R CC synth-2D]
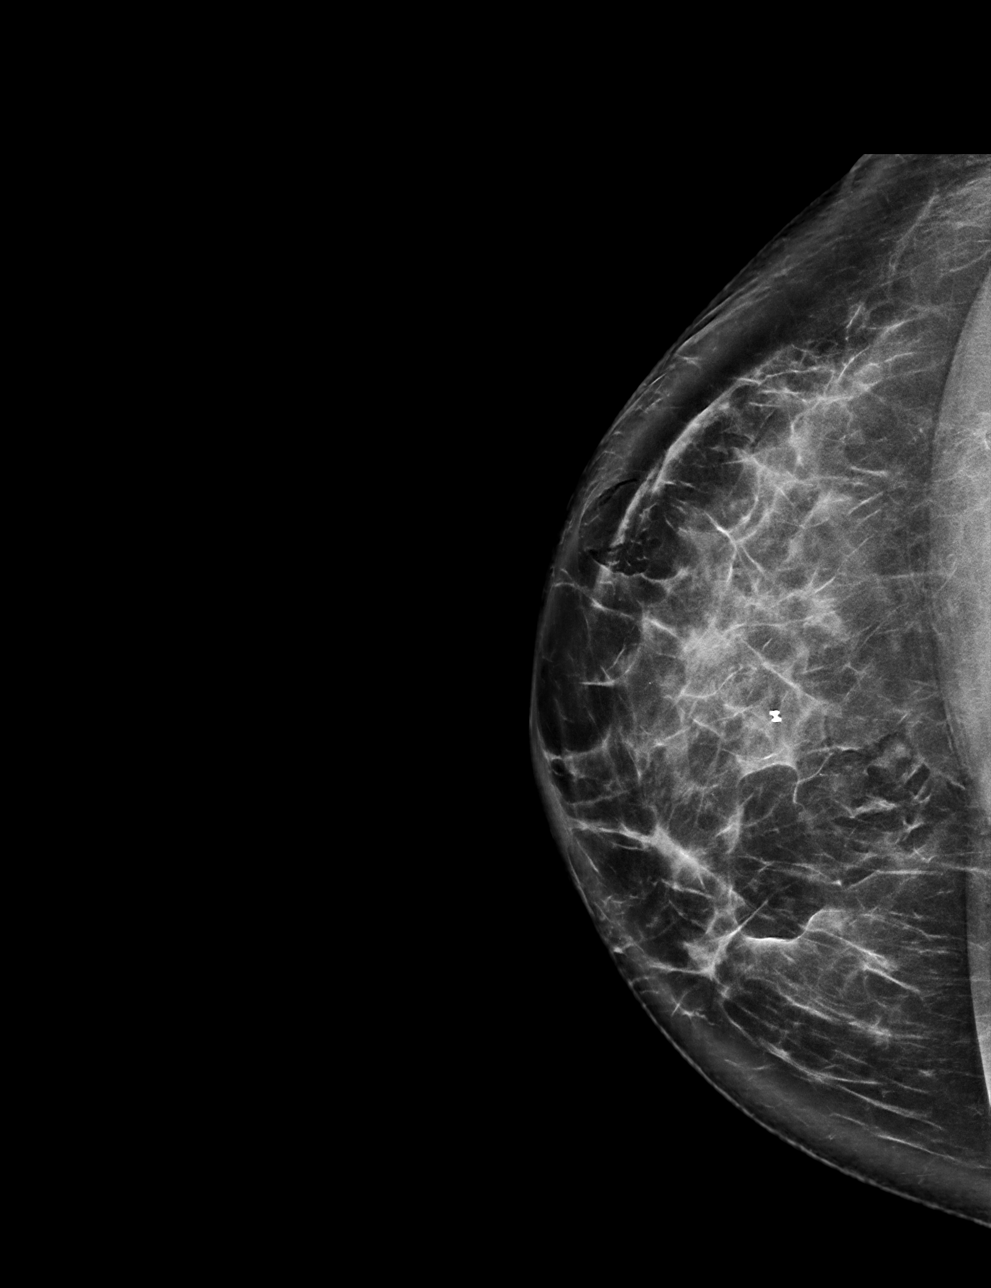

[R ML synth-2D]
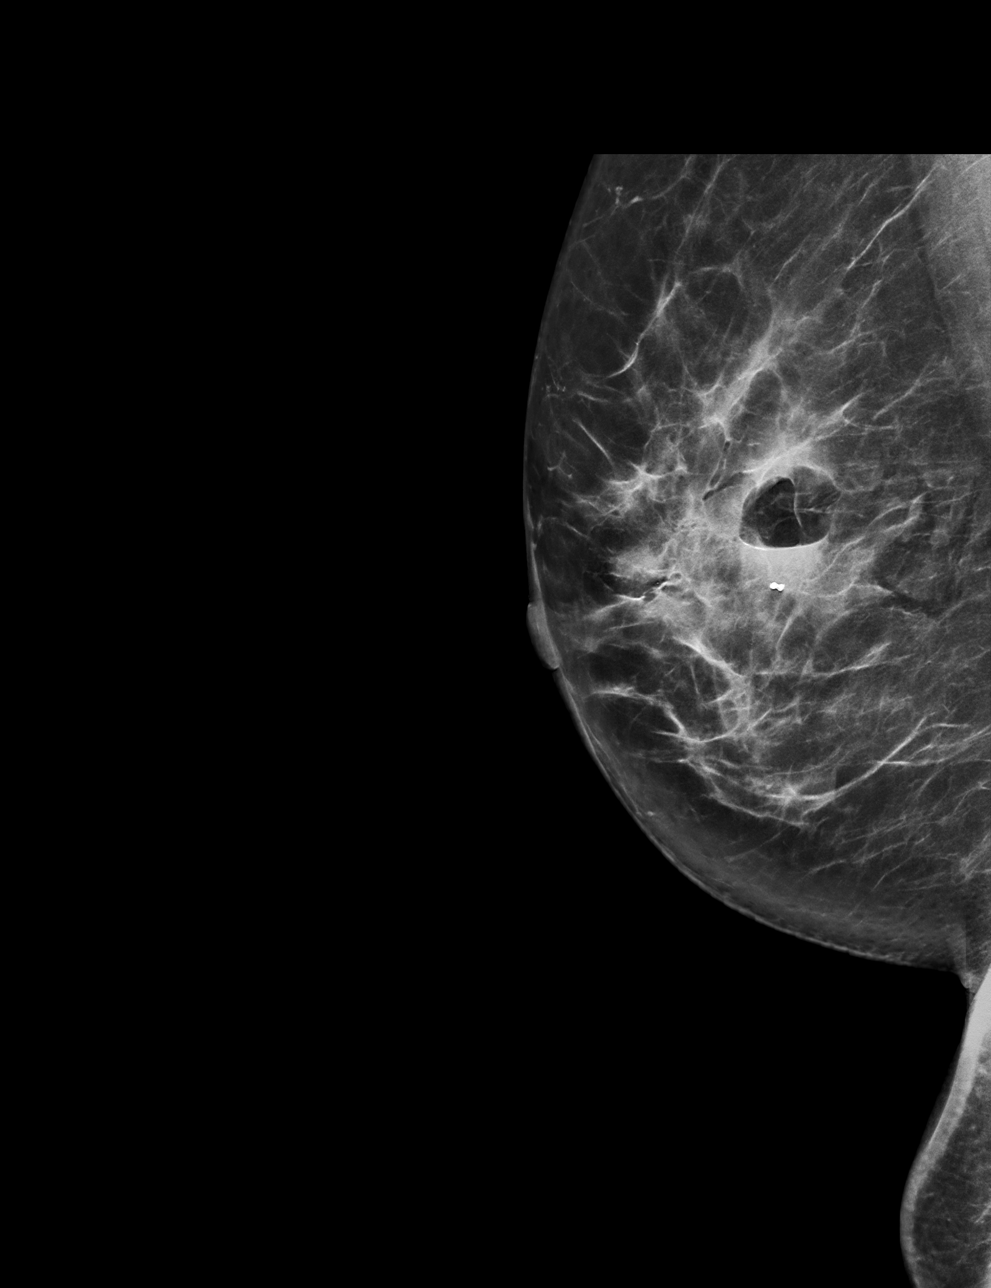

[R CC tomo · tomo slice 44/87.0]
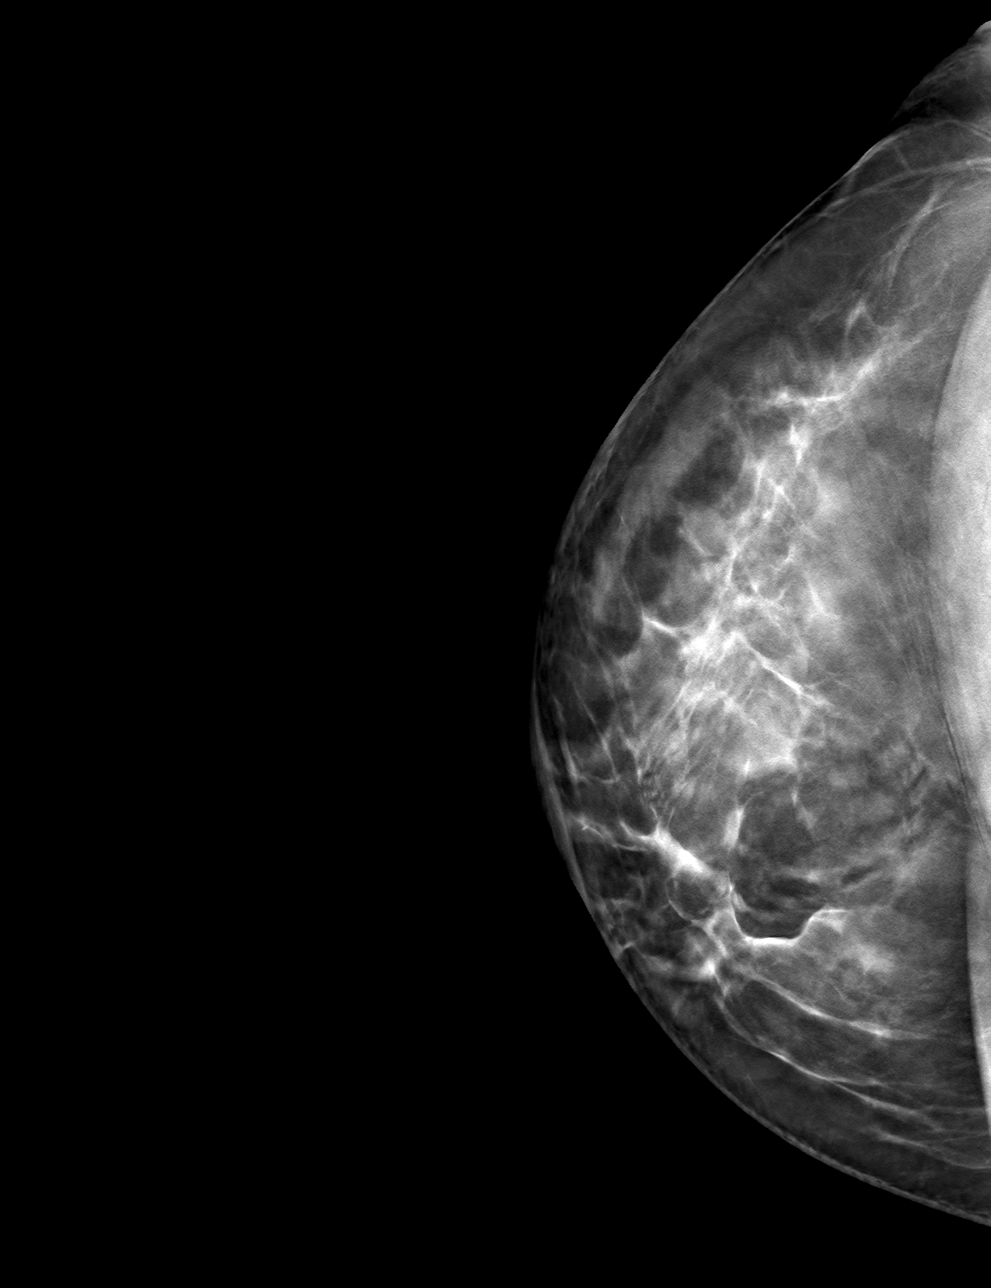

[R ML tomo · tomo slice 40/79.0]
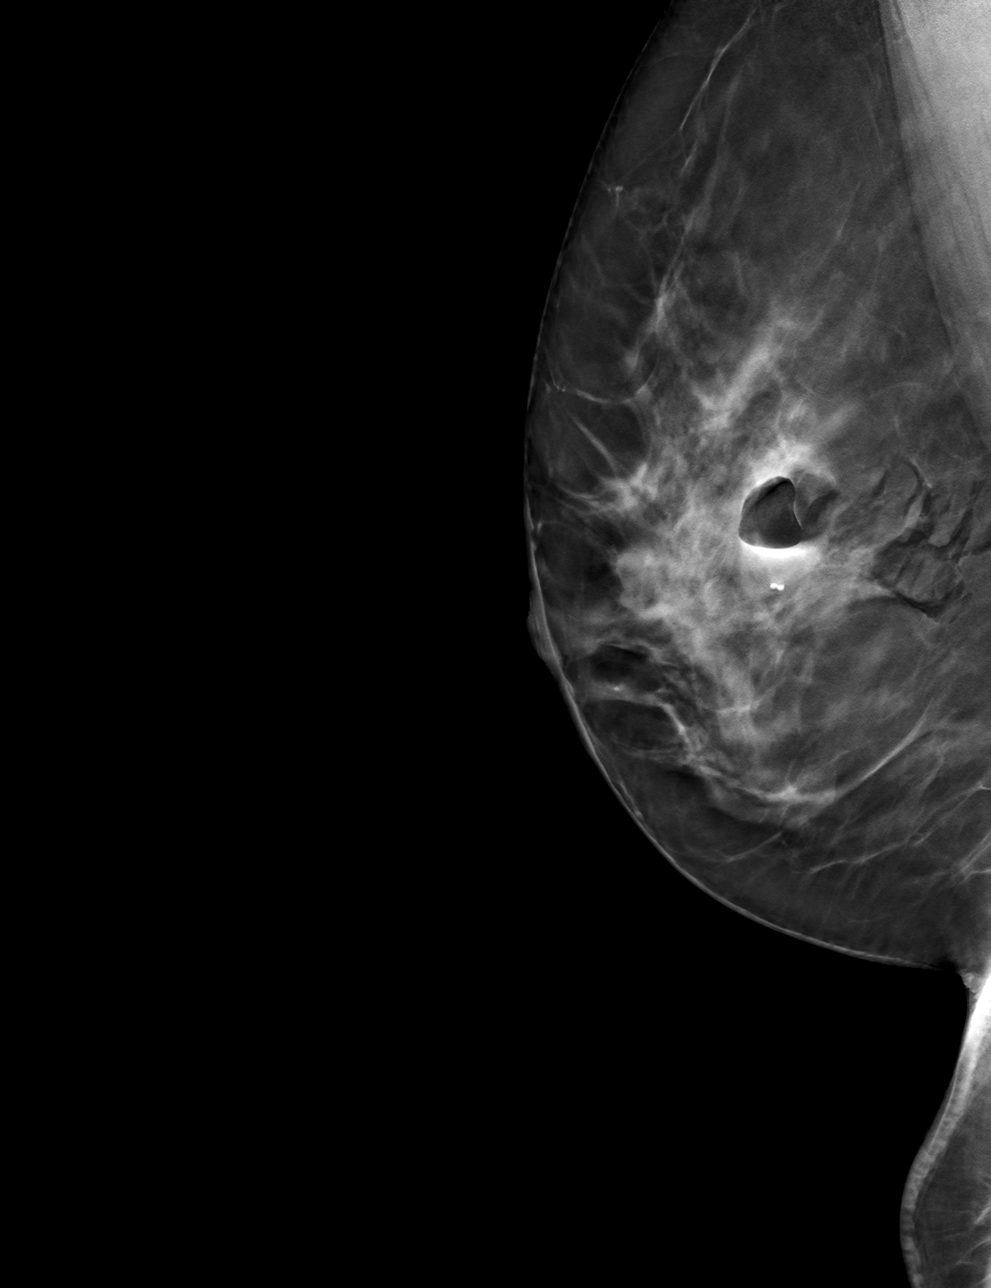

[4 of 12 positions shown; findings below may reference images not displayed]

FINDINGS: 3D Mammographic images were obtained following MR guided biopsy of a
0.7 cm central LEFT breast mass and MR guided biopsy of a 0.6 cm
central RIGHT breast mass.

The BARBELL biopsy marking clip is in expected position at the site
of biopsy within the UPPER central LEFT breast.

The BARBELL biopsy marking clip is in expected position at the site
of biopsy within the central RIGHT breast.
IMPRESSION: Appropriate positioning of the BARBELL shaped biopsy marking clip at
the site of biopsy in the UPPER central LEFT breast.

Appropriate positioning of the BARBELL shaped biopsy marking clip at
the site of biopsy in the central RIGHT breast.

Final Assessment: Post Procedure Mammograms for Marker Placement

## 2021-09-07 IMAGING — MG MM BREAST LOCALIZATION CLIP
4 series · 4 of 12 positions shown · non-contrast
Comparison: Previous exam(s).

CLINICAL DATA: Evaluate placement of BARBELL biopsy clips following
MR guided LEFT breast biopsy and MR guided RIGHT breast biopsy.

EXAM:
3D DIAGNOSTIC BILATERAL MAMMOGRAM POST MRI BIOPSY

[L CC synth-2D]
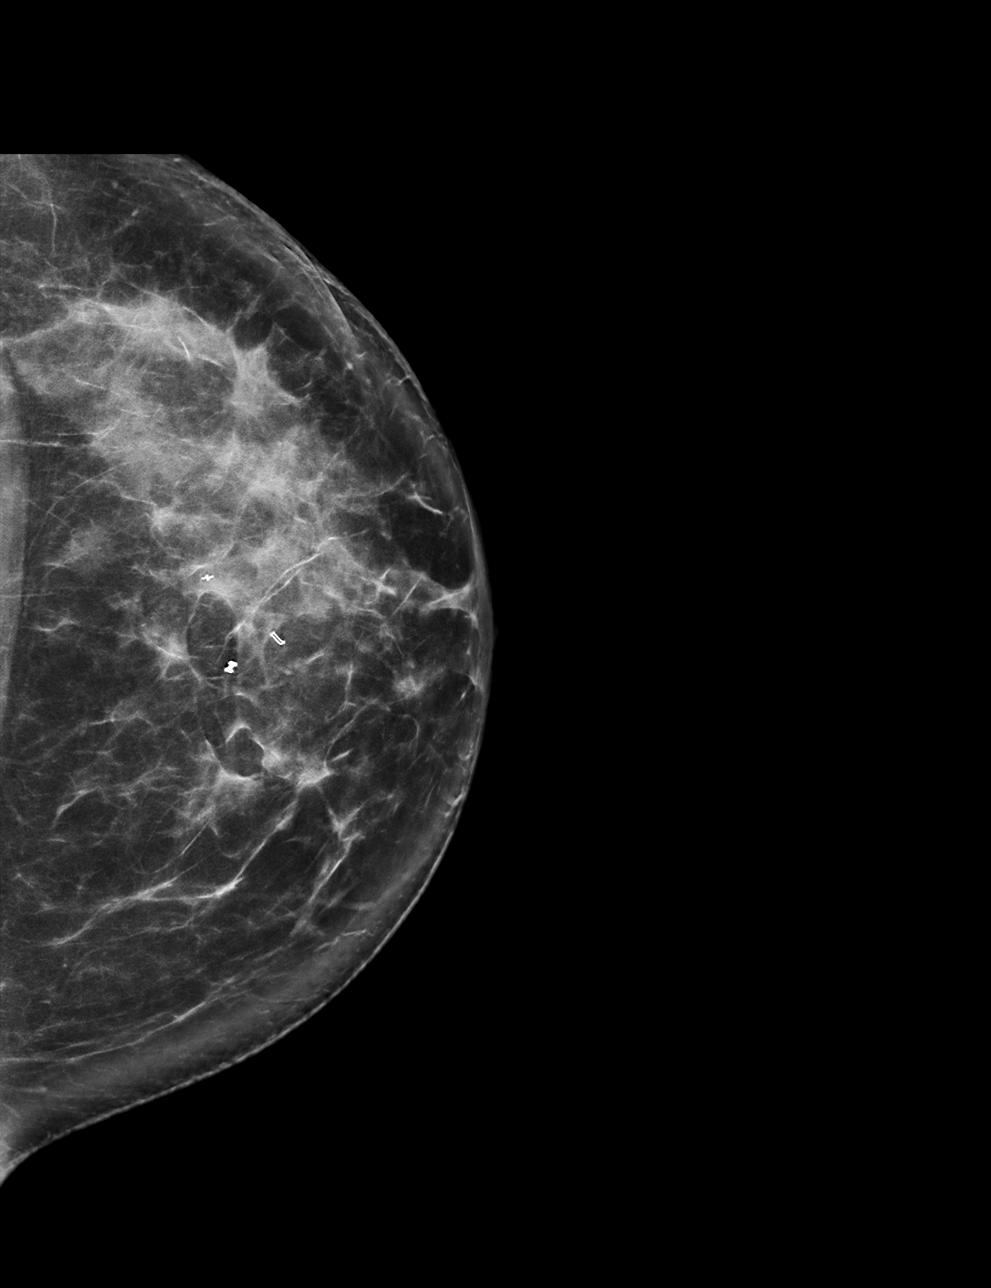

[L ML synth-2D]
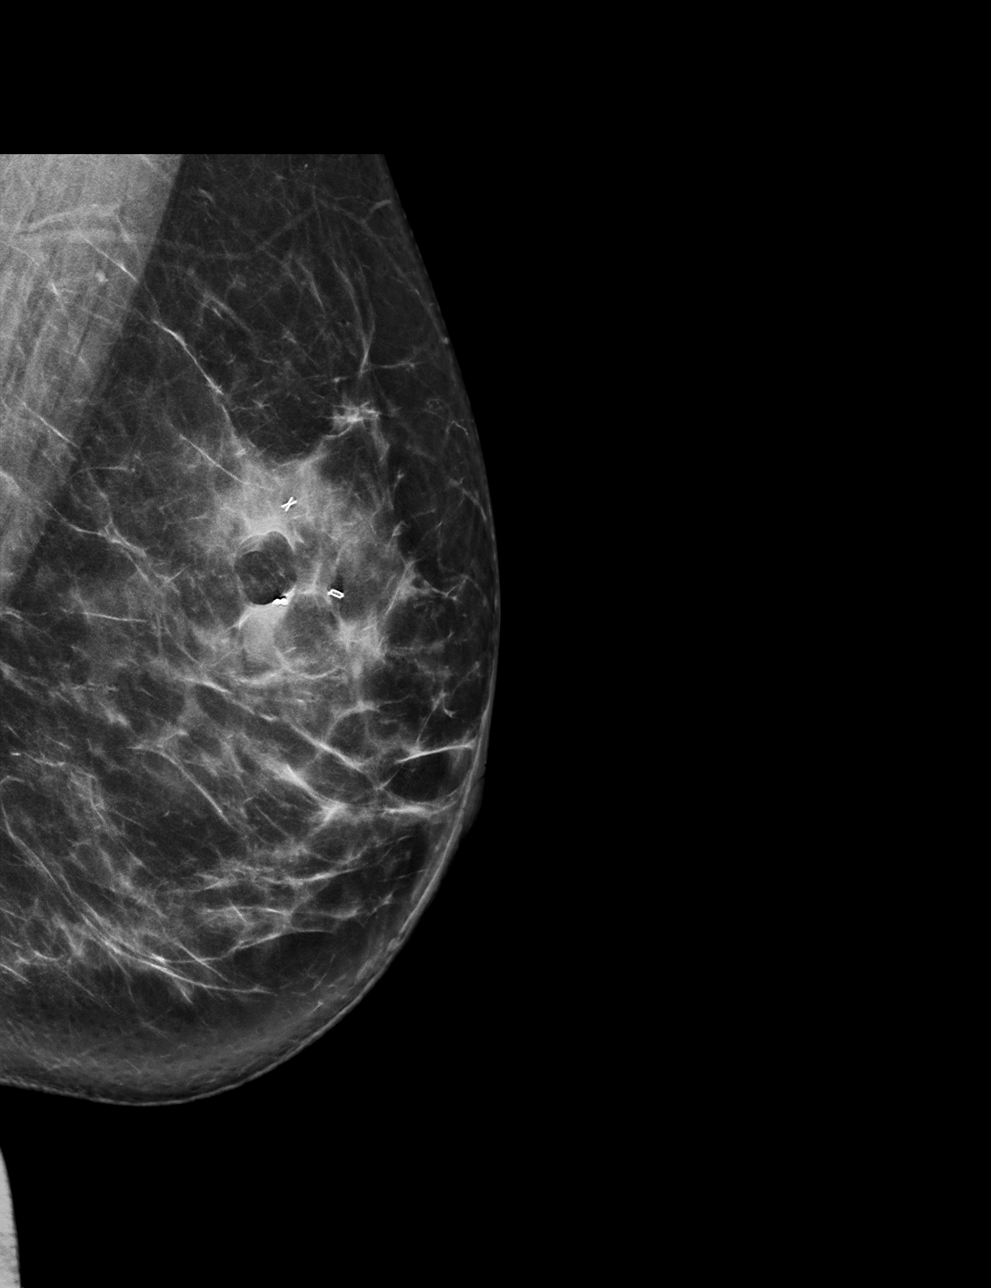

[L ML tomo · tomo slice 39/76.0]
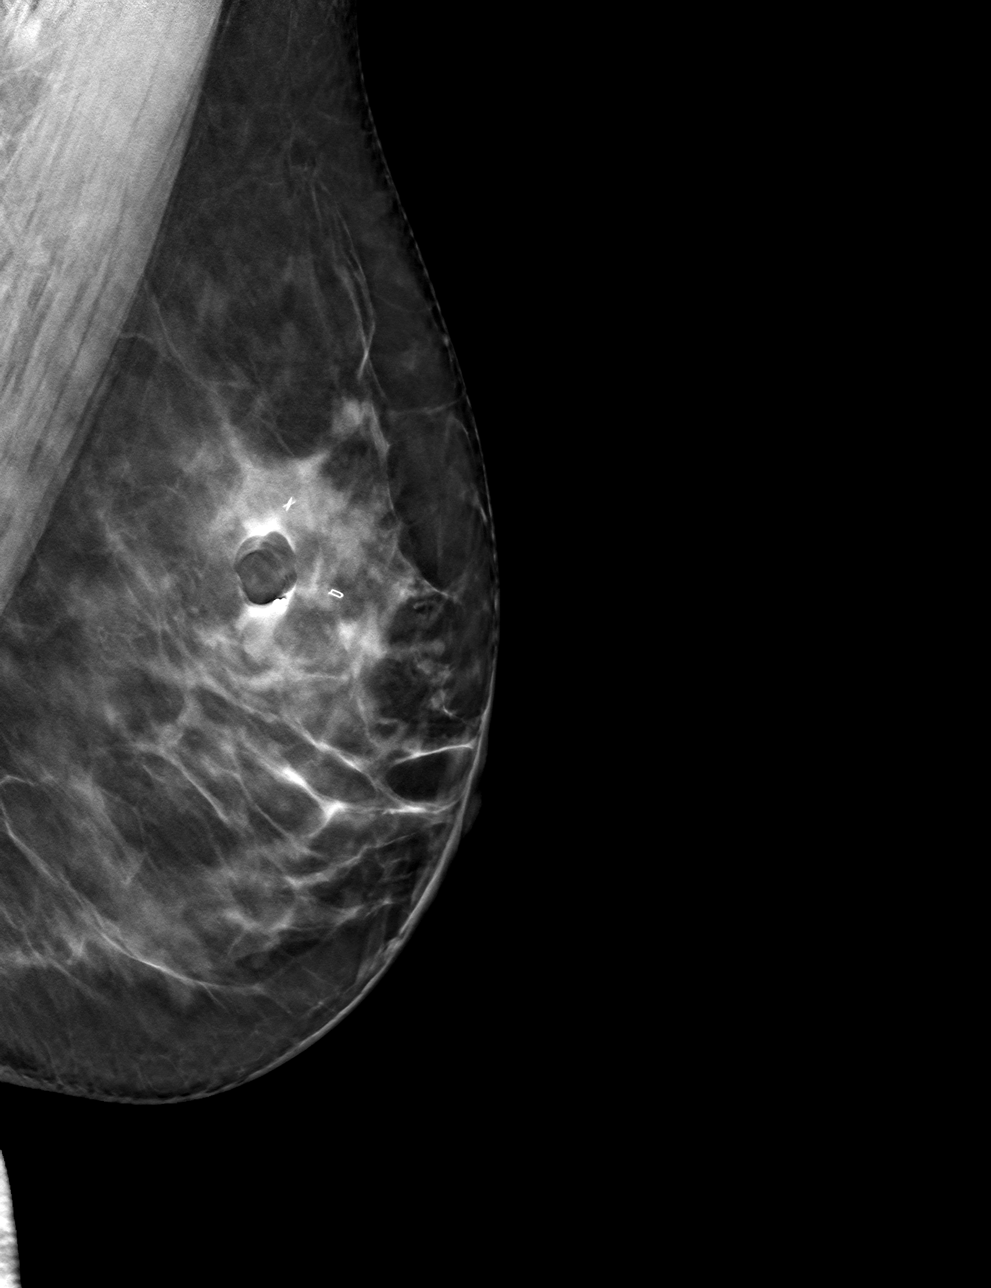

[L CC tomo · tomo slice 41/82.0]
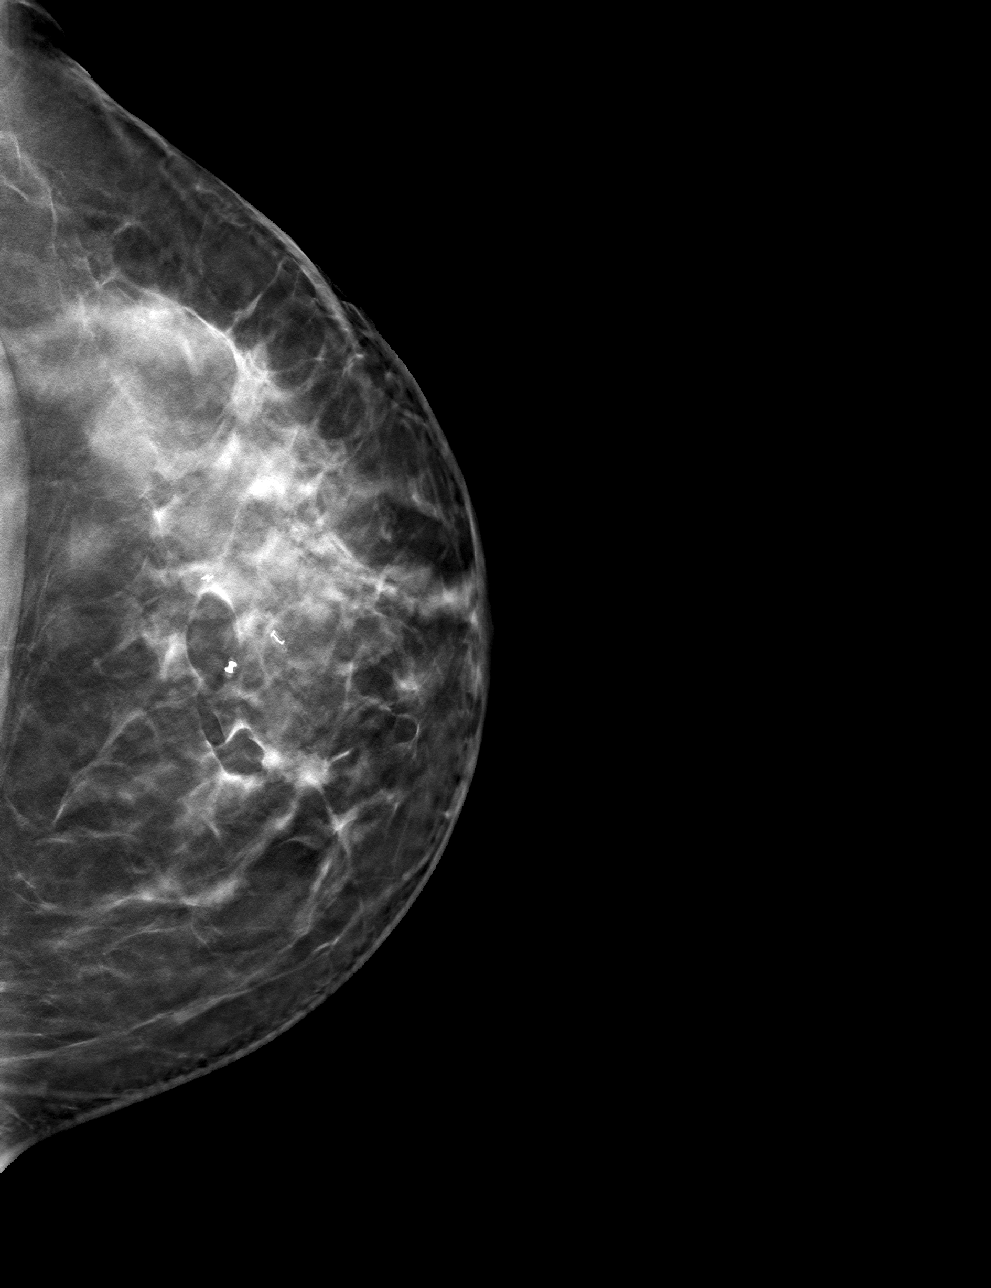

[4 of 12 positions shown; findings below may reference images not displayed]

FINDINGS: 3D Mammographic images were obtained following MR guided biopsy of a
0.7 cm central LEFT breast mass and MR guided biopsy of a 0.6 cm
central RIGHT breast mass.

The BARBELL biopsy marking clip is in expected position at the site
of biopsy within the UPPER central LEFT breast.

The BARBELL biopsy marking clip is in expected position at the site
of biopsy within the central RIGHT breast.
IMPRESSION: Appropriate positioning of the BARBELL shaped biopsy marking clip at
the site of biopsy in the UPPER central LEFT breast.

Appropriate positioning of the BARBELL shaped biopsy marking clip at
the site of biopsy in the central RIGHT breast.

Final Assessment: Post Procedure Mammograms for Marker Placement

## 2021-09-07 MED ORDER — GADOBUTROL 1 MMOL/ML IV SOLN
7.0000 mL | Freq: Once | INTRAVENOUS | Status: AC | PRN
  Administered 2021-09-07: 7 mL via INTRAVENOUS

## 2021-12-21 ENCOUNTER — Other Ambulatory Visit: Payer: Self-pay | Admitting: Obstetrics and Gynecology

## 2021-12-21 DIAGNOSIS — Z1231 Encounter for screening mammogram for malignant neoplasm of breast: Secondary | ICD-10-CM

## 2022-02-12 ENCOUNTER — Other Ambulatory Visit: Payer: Self-pay | Admitting: Obstetrics and Gynecology

## 2022-02-12 ENCOUNTER — Ambulatory Visit
Admission: RE | Admit: 2022-02-12 | Discharge: 2022-02-12 | Disposition: A | Discharge status: Home / Self Care | Payer: 59 | Source: Ambulatory Visit | Attending: Obstetrics and Gynecology | Admitting: Obstetrics and Gynecology

## 2022-02-12 ENCOUNTER — Other Ambulatory Visit: Payer: Self-pay | Admitting: General Surgery

## 2022-02-12 DIAGNOSIS — Z1231 Encounter for screening mammogram for malignant neoplasm of breast: Secondary | ICD-10-CM

## 2022-02-12 DIAGNOSIS — Z9189 Other specified personal risk factors, not elsewhere classified: Secondary | ICD-10-CM

## 2022-02-12 DIAGNOSIS — R921 Mammographic calcification found on diagnostic imaging of breast: Secondary | ICD-10-CM

## 2022-07-21 ENCOUNTER — Other Ambulatory Visit: Payer: Self-pay | Admitting: General Surgery

## 2022-07-21 DIAGNOSIS — N6089 Other benign mammary dysplasias of unspecified breast: Secondary | ICD-10-CM

## 2022-07-21 DIAGNOSIS — Z1239 Encounter for other screening for malignant neoplasm of breast: Secondary | ICD-10-CM

## 2022-08-17 ENCOUNTER — Ambulatory Visit
Admission: RE | Admit: 2022-08-17 | Discharge: 2022-08-17 | Disposition: A | Discharge status: Home / Self Care | Payer: 59 | Source: Ambulatory Visit | Attending: General Surgery | Admitting: General Surgery

## 2022-08-17 DIAGNOSIS — Z1239 Encounter for other screening for malignant neoplasm of breast: Secondary | ICD-10-CM

## 2022-08-17 DIAGNOSIS — N6089 Other benign mammary dysplasias of unspecified breast: Secondary | ICD-10-CM

## 2022-08-17 MED ORDER — GADOPICLENOL 0.5 MMOL/ML IV SOLN
8.0000 mL | Freq: Once | INTRAVENOUS | Status: AC | PRN
  Administered 2022-08-17: 8 mL via INTRAVENOUS

## 2022-11-16 ENCOUNTER — Ambulatory Visit: Payer: 59 | Admitting: Cardiology

## 2022-11-17 ENCOUNTER — Encounter: Payer: Self-pay | Admitting: Cardiology

## 2022-11-17 ENCOUNTER — Ambulatory Visit: Payer: 59 | Admitting: Cardiology

## 2022-11-17 VITALS — BP 135/81 | Resp 16 | Ht 66.0 in | Wt 177.2 lb

## 2022-11-17 DIAGNOSIS — E7800 Pure hypercholesterolemia, unspecified: Secondary | ICD-10-CM

## 2022-11-17 DIAGNOSIS — E559 Vitamin D deficiency, unspecified: Secondary | ICD-10-CM

## 2022-11-17 DIAGNOSIS — E119 Type 2 diabetes mellitus without complications: Secondary | ICD-10-CM

## 2022-11-17 DIAGNOSIS — R0609 Other forms of dyspnea: Secondary | ICD-10-CM

## 2022-11-17 NOTE — Progress Notes (Unsigned)
Primary Physician/Referring:  Lewis Moccasin, MD  Patient ID: Frances Hartman, female    DOB: 12-13-1980, 42 y.o.   MRN: [id number]  Chief Complaint  Patient presents with   DOE   New Patient (Initial Visit)    Referred by Dorisann Frames, MD   HPI:    Frances Hartman  is a 42 y.o. Asian [place] female patient with diabetes mellitus, mild hypercholesterolemia, referred to me for evaluation of dyspnea on exertion.  Patient states that over the past several months, she has noticed her heart rate goes up very easily when she exerts.  She tries to be active, heart rate races up to 150 to 170 bpm with activities of exertion.  She is also noticed dyspnea on exertion.  Past Medical History:  Diagnosis Date   Diabetes mellitus without complication (HCC)    Hyperlipidemia    Past Surgical History:  Procedure Laterality Date   BREAST BIOPSY Left    BREAST EXCISIONAL BIOPSY Right    benign excisional biopsy 2013 in [place]   MICRODISCECTOMY LUMBAR  2011   L4 and L5   OVARIAN CYST REMOVAL Right 2016   Family History  Problem Relation Age of Onset   Heart disease Mother    Heart disease Father    Heart disease Maternal Grandmother    Diabetes Maternal Grandfather    Heart disease Paternal Grandmother    Diabetes Paternal Grandfather    Breast cancer Neg Hx     Social History   Tobacco Use   Smoking status: Never   Smokeless tobacco: Never  Substance Use Topics   Alcohol use: Never   Marital Status: Married  ROS  Review of Systems  Cardiovascular:  Negative for chest pain, dyspnea on exertion and leg swelling.   Objective      11/17/2022   10:24 AM  Vitals with BMI  Height 5\' 6"   Weight 177 lbs 3 oz  BMI 28.61  Systolic 135  Diastolic 81   Blood pressure 135/81, resp. rate 16, height 5\' 6"  (1.676 m), weight 177 lb 3.2 oz (80.4 kg).  Physical Exam Neck:     Vascular: No carotid bruit or JVD.  Cardiovascular:     Rate and Rhythm: Normal rate and regular rhythm.      Pulses: Intact distal pulses.     Heart sounds: Normal heart sounds. No murmur heard.    No gallop.  Pulmonary:     Effort: Pulmonary effort is normal.     Breath sounds: Normal breath sounds.  Abdominal:     General: Bowel sounds are normal.     Palpations: Abdomen is soft.  Musculoskeletal:     Right lower leg: No edema.     Left lower leg: No edema.    Laboratory examination:   External labs:   Labs 10/26/2022:  Hb 12.2/HCT 37.2, platelets 384.  Ferritin reduced at 10.  A1c 6.9%.  TSH normal at 1.760.  Serum glucose 127, BUN 8, creatinine 0.86, EGFR 88 mL, potassium 4.6, LFTs normal.  Total cholesterol 181, triglycerides 115, HDL 51, LDL 109.    Labs 03/30/2022:  Serum glucose 127 mg, BUN 8, creatinine 0.86, EGFR 86 mL, potassium 4.6.  LFTs normal.  A1c 6.7%.  TSH normal at 1.96.  Total cholesterol 180, triglycerides 95, HDL 46, LDL 117.  Radiology:    Cardiac Studies:   Coronary calcium score 08/21/2021 Total Agatston coronary calcium score 0. MESA database percentile <1. Visualized ascending and descending aorta normal in size.  Extracardiac abnormalities: None.   EKG:   EKG 11/17/2022: Normal sinus rhythm with rate of 82 bpm, borderline left atrial enlargement, otherwise normal EKG.   Medications and allergies   No Known Allergies   Medication list   Current Outpatient Medications:    Cholecalciferol 10 MCG (400 UNIT) CAPS, Take 1 capsule by mouth daily., Disp: , Rfl:    levonorgestrel-ethinyl estradiol (NORDETTE) 0.15-30 MG-MCG tablet, Take 1 tablet by mouth daily., Disp: , Rfl:    MOUNJARO 2.5 MG/0.5ML Pen, Inject 2.5 mg into the skin once a week., Disp: , Rfl:    MOUNJARO 5 MG/0.5ML Pen, Inject 5 mg into the skin once a week. (Patient not taking: Reported on 11/17/2022), Disp: , Rfl:   Assessment     ICD-10-CM   1. Dyspnea on exertion  R06.09 EKG 12-Lead    PCV ECHOCARDIOGRAM COMPLETE    PCV MYOCARDIAL PERFUSION WO LEXISCAN    2. Type 2 diabetes  mellitus without complication, without long-term current use of insulin (HCC)  E11.9 PCV ECHOCARDIOGRAM COMPLETE    PCV MYOCARDIAL PERFUSION WO LEXISCAN    3. Mild hypercholesterolemia  E78.00 Lipoprotein A (LPA)    PCV ECHOCARDIOGRAM COMPLETE    PCV MYOCARDIAL PERFUSION WO LEXISCAN    4. Hypovitaminosis D  E55.9 VITAMIN D 25 Hydroxy (Vit-D Deficiency, Fractures)      Orders Placed This Encounter  Procedures   Lipoprotein A (LPA)   VITAMIN D 25 Hydroxy (Vit-D Deficiency, Fractures)   PCV MYOCARDIAL PERFUSION WO LEXISCAN    Standing Status:   Future    Standing Expiration Date:   01/18/2023   EKG 12-Lead   PCV ECHOCARDIOGRAM COMPLETE    Standing Status:   Future    Standing Expiration Date:   11/17/2023   No orders of the defined types were placed in this encounter.  There are no discontinued medications.   Recommendations:   Frances Hartman is a 42 y.o. Asian [place] female patient with diabetes mellitus, mild hypercholesterolemia, referred to me for evaluation of dyspnea on exertion.   1. Dyspnea on exertion Patient's elevated heart rate with minimal exertion and also dyspnea on exertion could be anginal equivalent in view of diabetes mellitus condition hypertension, I will set her up for an echocardiogram to exclude structural heart disease and also an exercise nuclear stress test.  Coronary calcium score was 0. - EKG 12-Lead - PCV ECHOCARDIOGRAM COMPLETE; Future - PCV MYOCARDIAL PERFUSION WO LEXISCAN; Future  2. Type 2 diabetes mellitus without complication, without long-term current use of insulin (HCC) Patient is presently diabetic and is on Wegovy for weight loss and diabetes control. - PCV ECHOCARDIOGRAM COMPLETE; Future - PCV MYOCARDIAL PERFUSION WO LEXISCAN; Future  3. Mild hypercholesterolemia In view of diabetes status, would recommend LDL goal <70.  Her 10-year risk of atherosclerotic cardiovascular disease is only 1.5%.  However this does not take into account that  she is [place] and a female patient.  Will check Lp(a). - Lipoprotein A (LPA) - PCV ECHOCARDIOGRAM COMPLETE; Future - PCV MYOCARDIAL PERFUSION WO LEXISCAN; Future  4. Hypovitaminosis D Patient has diagnosis of hypovitaminosis D, will check vitamin D levels.  I will see her back in 6 to 8 weeks for follow-up.  Continue - VITAMIN D 25 Hydroxy (Vit-D Deficiency, Fractures)  Other orders - Cholecalciferol 10 MCG (400 UNIT) CAPS; Take 1 capsule by mouth daily. - MOUNJARO 2.5 MG/0.5ML Pen; Inject 2.5 mg into the skin once a week. - levonorgestrel-ethinyl estradiol (NORDETTE) 0.15-30 MG-MCG tablet; Take 1  tablet by mouth daily. - MOUNJARO 5 MG/0.5ML Pen; Inject 5 mg into the skin once a week. (Patient not taking: Reported on 11/17/2022)    Yates Decamp, MD, [facility] 11/18/2022, 8:24 PM Office: [phone]        CCC: Sabino Gasser

## 2022-11-18 ENCOUNTER — Encounter: Payer: Self-pay | Admitting: Cardiology

## 2022-11-23 LAB — LIPOPROTEIN A (LPA): Lipoprotein (a): 66.3 nmol/L (ref <75.0)

## 2022-11-23 LAB — VITAMIN D 25 HYDROXY (VIT D DEFICIENCY, FRACTURES): Vit D, 25-Hydroxy: 23.7 ng/mL — ABNORMAL LOW (ref 30.0–100.0)

## 2022-11-25 ENCOUNTER — Other Ambulatory Visit: Payer: 59

## 2022-11-25 DIAGNOSIS — E7800 Pure hypercholesterolemia, unspecified: Secondary | ICD-10-CM

## 2022-11-25 DIAGNOSIS — E119 Type 2 diabetes mellitus without complications: Secondary | ICD-10-CM

## 2022-11-25 DIAGNOSIS — R0609 Other forms of dyspnea: Secondary | ICD-10-CM

## 2022-12-10 ENCOUNTER — Ambulatory Visit: Payer: 59

## 2022-12-10 DIAGNOSIS — E119 Type 2 diabetes mellitus without complications: Secondary | ICD-10-CM

## 2022-12-10 DIAGNOSIS — R0609 Other forms of dyspnea: Secondary | ICD-10-CM

## 2022-12-10 DIAGNOSIS — E7800 Pure hypercholesterolemia, unspecified: Secondary | ICD-10-CM

## 2023-01-05 ENCOUNTER — Other Ambulatory Visit: Payer: Self-pay | Admitting: General Surgery

## 2023-01-05 DIAGNOSIS — R928 Other abnormal and inconclusive findings on diagnostic imaging of breast: Secondary | ICD-10-CM

## 2023-01-12 ENCOUNTER — Ambulatory Visit: Payer: 59 | Admitting: Cardiology

## 2023-02-23 ENCOUNTER — Ambulatory Visit
Admission: RE | Admit: 2023-02-23 | Discharge: 2023-02-23 | Disposition: A | Discharge status: Home / Self Care | Payer: 59 | Source: Ambulatory Visit | Attending: General Surgery | Admitting: General Surgery

## 2023-02-23 DIAGNOSIS — R928 Other abnormal and inconclusive findings on diagnostic imaging of breast: Secondary | ICD-10-CM

## 2023-11-09 ENCOUNTER — Other Ambulatory Visit (HOSPITAL_BASED_OUTPATIENT_CLINIC_OR_DEPARTMENT_OTHER): Payer: Self-pay | Admitting: Endocrinology

## 2023-11-09 DIAGNOSIS — E7800 Pure hypercholesterolemia, unspecified: Secondary | ICD-10-CM

## 2023-11-22 ENCOUNTER — Ambulatory Visit (HOSPITAL_COMMUNITY)
Admission: RE | Admit: 2023-11-22 | Discharge: 2023-11-22 | Disposition: A | Discharge status: Home / Self Care | Payer: Self-pay | Source: Ambulatory Visit | Attending: Endocrinology | Admitting: Endocrinology

## 2023-11-22 DIAGNOSIS — E7800 Pure hypercholesterolemia, unspecified: Secondary | ICD-10-CM | POA: Insufficient documentation

## 2024-01-03 ENCOUNTER — Other Ambulatory Visit: Payer: Self-pay | Admitting: General Surgery

## 2024-01-03 DIAGNOSIS — Z1231 Encounter for screening mammogram for malignant neoplasm of breast: Secondary | ICD-10-CM

## 2024-02-24 ENCOUNTER — Ambulatory Visit

## 2024-02-27 ENCOUNTER — Ambulatory Visit
Admission: RE | Admit: 2024-02-27 | Discharge: 2024-02-27 | Disposition: A | Source: Ambulatory Visit | Attending: General Surgery | Admitting: General Surgery

## 2024-02-27 DIAGNOSIS — Z1231 Encounter for screening mammogram for malignant neoplasm of breast: Secondary | ICD-10-CM

## 2024-02-27 MED ORDER — IOPAMIDOL (ISOVUE-370) INJECTION 76%
100.0000 mL | Freq: Once | INTRAVENOUS | Status: AC | PRN
  Administered 2024-02-27: 100 mL via INTRAVENOUS
